# Patient Record
Sex: Female | Born: 1970 | Hispanic: Yes | Smoking: Never smoker
Health system: Southern US, Community
[De-identification: ages and names within clinical notes are randomized; demographics above are authoritative.]

## PROBLEM LIST (undated history)

## (undated) DIAGNOSIS — K589 Irritable bowel syndrome without diarrhea: Secondary | ICD-10-CM

## (undated) DIAGNOSIS — M797 Fibromyalgia: Secondary | ICD-10-CM

## (undated) DIAGNOSIS — IMO0002 Reserved for concepts with insufficient information to code with codable children: Secondary | ICD-10-CM

## (undated) DIAGNOSIS — T7840XA Allergy, unspecified, initial encounter: Secondary | ICD-10-CM

## (undated) DIAGNOSIS — M3500 Sicca syndrome, unspecified: Secondary | ICD-10-CM

## (undated) DIAGNOSIS — F419 Anxiety disorder, unspecified: Secondary | ICD-10-CM

## (undated) DIAGNOSIS — F329 Major depressive disorder, single episode, unspecified: Secondary | ICD-10-CM

## (undated) DIAGNOSIS — M329 Systemic lupus erythematosus, unspecified: Secondary | ICD-10-CM

## (undated) DIAGNOSIS — R109 Unspecified abdominal pain: Secondary | ICD-10-CM

## (undated) DIAGNOSIS — C539 Malignant neoplasm of cervix uteri, unspecified: Secondary | ICD-10-CM

## (undated) DIAGNOSIS — E039 Hypothyroidism, unspecified: Secondary | ICD-10-CM

## (undated) DIAGNOSIS — J189 Pneumonia, unspecified organism: Secondary | ICD-10-CM

## (undated) DIAGNOSIS — N809 Endometriosis, unspecified: Secondary | ICD-10-CM

## (undated) DIAGNOSIS — C73 Malignant neoplasm of thyroid gland: Secondary | ICD-10-CM

## (undated) DIAGNOSIS — D649 Anemia, unspecified: Secondary | ICD-10-CM

## (undated) DIAGNOSIS — F32A Depression, unspecified: Secondary | ICD-10-CM

## (undated) DIAGNOSIS — J45909 Unspecified asthma, uncomplicated: Secondary | ICD-10-CM

## (undated) DIAGNOSIS — E063 Autoimmune thyroiditis: Secondary | ICD-10-CM

## (undated) DIAGNOSIS — M1990 Unspecified osteoarthritis, unspecified site: Secondary | ICD-10-CM

## (undated) HISTORY — PX: ABLATION: SHX5711

## (undated) HISTORY — DX: Unspecified asthma, uncomplicated: J45.909

## (undated) HISTORY — PX: THROAT SURGERY: SHX803

## (undated) HISTORY — DX: Allergy, unspecified, initial encounter: T78.40XA

## (undated) HISTORY — PX: OTHER SURGICAL HISTORY: SHX169

## (undated) HISTORY — DX: Endometriosis, unspecified: N80.9

## (undated) HISTORY — DX: Hypothyroidism, unspecified: E03.9

## (undated) HISTORY — DX: Irritable bowel syndrome, unspecified: K58.9

## (undated) HISTORY — DX: Major depressive disorder, single episode, unspecified: F32.9

## (undated) HISTORY — PX: THYROIDECTOMY: SHX17

## (undated) HISTORY — PX: ABDOMINAL HYSTERECTOMY: SHX81

## (undated) HISTORY — DX: Malignant neoplasm of thyroid gland: C73

## (undated) HISTORY — DX: Unspecified osteoarthritis, unspecified site: M19.90

## (undated) HISTORY — DX: Anemia, unspecified: D64.9

## (undated) HISTORY — PX: OVARIAN CYST SURGERY: SHX726

## (undated) HISTORY — DX: Fibromyalgia: M79.7

## (undated) HISTORY — DX: Autoimmune thyroiditis: E06.3

## (undated) HISTORY — DX: Anxiety disorder, unspecified: F41.9

## (undated) HISTORY — DX: Sjogren syndrome, unspecified: M35.00

## (undated) HISTORY — DX: Unspecified abdominal pain: R10.9

## (undated) HISTORY — DX: Reserved for concepts with insufficient information to code with codable children: IMO0002

## (undated) HISTORY — DX: Systemic lupus erythematosus, unspecified: M32.9

## (undated) HISTORY — DX: Pneumonia, unspecified organism: J18.9

## (undated) HISTORY — DX: Depression, unspecified: F32.A

## (undated) HISTORY — DX: Malignant neoplasm of cervix uteri, unspecified: C53.9

---

## 1997-12-26 ENCOUNTER — Ambulatory Visit (HOSPITAL_COMMUNITY): Admission: RE | Admit: 1997-12-26 | Discharge: 1997-12-26 | Payer: Self-pay | Admitting: Oral Surgery

## 1997-12-26 ENCOUNTER — Encounter: Payer: Self-pay | Admitting: Oral Surgery

## 2001-03-03 ENCOUNTER — Emergency Department (HOSPITAL_COMMUNITY): Admission: EM | Admit: 2001-03-03 | Discharge: 2001-03-03 | Payer: Self-pay | Admitting: Internal Medicine

## 2001-05-18 ENCOUNTER — Inpatient Hospital Stay (HOSPITAL_COMMUNITY): Admission: RE | Admit: 2001-05-18 | Discharge: 2001-05-24 | Payer: Self-pay | Admitting: Psychiatry

## 2001-05-25 ENCOUNTER — Other Ambulatory Visit (HOSPITAL_COMMUNITY): Admission: RE | Admit: 2001-05-25 | Discharge: 2001-06-05 | Payer: Self-pay | Admitting: Psychiatry

## 2004-07-08 ENCOUNTER — Ambulatory Visit: Payer: Self-pay | Admitting: Family Medicine

## 2004-07-12 ENCOUNTER — Ambulatory Visit (HOSPITAL_COMMUNITY): Admission: RE | Admit: 2004-07-12 | Discharge: 2004-07-12 | Payer: Self-pay | Admitting: Family Medicine

## 2004-10-08 ENCOUNTER — Ambulatory Visit: Payer: Self-pay | Admitting: Family Medicine

## 2005-01-24 ENCOUNTER — Ambulatory Visit: Payer: Self-pay | Admitting: Family Medicine

## 2005-02-03 ENCOUNTER — Ambulatory Visit: Payer: Self-pay | Admitting: Family Medicine

## 2005-02-03 ENCOUNTER — Ambulatory Visit (HOSPITAL_COMMUNITY): Admission: RE | Admit: 2005-02-03 | Discharge: 2005-02-03 | Payer: Self-pay | Admitting: Family Medicine

## 2005-02-23 ENCOUNTER — Ambulatory Visit (HOSPITAL_COMMUNITY): Admission: RE | Admit: 2005-02-23 | Discharge: 2005-02-23 | Payer: Self-pay | Admitting: Family Medicine

## 2005-04-22 ENCOUNTER — Ambulatory Visit (HOSPITAL_COMMUNITY): Admission: RE | Admit: 2005-04-22 | Discharge: 2005-04-22 | Payer: Self-pay | Admitting: Otolaryngology

## 2005-04-29 ENCOUNTER — Encounter (INDEPENDENT_AMBULATORY_CARE_PROVIDER_SITE_OTHER): Payer: Self-pay | Admitting: *Deleted

## 2005-04-29 ENCOUNTER — Ambulatory Visit (HOSPITAL_COMMUNITY): Admission: RE | Admit: 2005-04-29 | Discharge: 2005-04-29 | Payer: Self-pay | Admitting: Otolaryngology

## 2005-05-03 ENCOUNTER — Ambulatory Visit: Payer: Self-pay | Admitting: Family Medicine

## 2005-06-03 ENCOUNTER — Ambulatory Visit: Payer: Self-pay | Admitting: Family Medicine

## 2005-06-08 ENCOUNTER — Ambulatory Visit (HOSPITAL_COMMUNITY): Admission: RE | Admit: 2005-06-08 | Discharge: 2005-06-09 | Payer: Self-pay | Admitting: Otolaryngology

## 2005-06-08 ENCOUNTER — Encounter (INDEPENDENT_AMBULATORY_CARE_PROVIDER_SITE_OTHER): Payer: Self-pay | Admitting: Specialist

## 2005-08-04 ENCOUNTER — Encounter: Payer: Self-pay | Admitting: Family Medicine

## 2005-08-04 ENCOUNTER — Ambulatory Visit: Payer: Self-pay | Admitting: Family Medicine

## 2005-08-04 ENCOUNTER — Other Ambulatory Visit: Admission: RE | Admit: 2005-08-04 | Discharge: 2005-08-04 | Payer: Self-pay | Admitting: Family Medicine

## 2006-02-21 ENCOUNTER — Ambulatory Visit: Payer: Self-pay | Admitting: Family Medicine

## 2006-10-27 ENCOUNTER — Ambulatory Visit (HOSPITAL_COMMUNITY): Admission: RE | Admit: 2006-10-27 | Discharge: 2006-10-27 | Payer: Self-pay | Admitting: Family Medicine

## 2007-02-19 ENCOUNTER — Ambulatory Visit (HOSPITAL_COMMUNITY): Admission: RE | Admit: 2007-02-19 | Discharge: 2007-02-19 | Payer: Self-pay | Admitting: Internal Medicine

## 2007-02-20 ENCOUNTER — Ambulatory Visit (HOSPITAL_COMMUNITY): Admission: RE | Admit: 2007-02-20 | Discharge: 2007-02-20 | Payer: Self-pay | Admitting: Internal Medicine

## 2007-03-22 ENCOUNTER — Encounter: Payer: Self-pay | Admitting: Family Medicine

## 2007-06-19 ENCOUNTER — Encounter: Admission: RE | Admit: 2007-06-19 | Discharge: 2007-06-19 | Payer: Self-pay | Admitting: Otolaryngology

## 2008-02-22 ENCOUNTER — Ambulatory Visit (HOSPITAL_COMMUNITY): Admission: RE | Admit: 2008-02-22 | Discharge: 2008-02-22 | Payer: Self-pay | Admitting: Obstetrics and Gynecology

## 2008-02-22 ENCOUNTER — Encounter (INDEPENDENT_AMBULATORY_CARE_PROVIDER_SITE_OTHER): Payer: Self-pay | Admitting: Obstetrics and Gynecology

## 2009-01-09 ENCOUNTER — Other Ambulatory Visit: Admission: RE | Admit: 2009-01-09 | Discharge: 2009-01-09 | Payer: Self-pay | Admitting: Obstetrics and Gynecology

## 2009-05-05 ENCOUNTER — Inpatient Hospital Stay (HOSPITAL_COMMUNITY): Admission: RE | Admit: 2009-05-05 | Discharge: 2009-05-07 | Payer: Self-pay | Admitting: Obstetrics and Gynecology

## 2009-05-05 ENCOUNTER — Encounter: Payer: Self-pay | Admitting: Obstetrics and Gynecology

## 2009-05-05 ENCOUNTER — Ambulatory Visit (HOSPITAL_COMMUNITY): Admission: RE | Admit: 2009-05-05 | Discharge: 2009-05-05 | Payer: Self-pay | Admitting: Obstetrics and Gynecology

## 2009-12-07 ENCOUNTER — Ambulatory Visit (HOSPITAL_COMMUNITY): Admission: RE | Admit: 2009-12-07 | Discharge: 2009-12-07 | Payer: Self-pay | Admitting: Family Medicine

## 2010-04-20 NOTE — Letter (Signed)
Summary: RPC chart  RPC chart   Imported By: Curtis Sites 10/08/2009 10:33:15  _____________________________________________________________________  External Attachment:    Type:   Image     Comment:   External Document

## 2010-06-10 LAB — CBC
HCT: 33.3 % — ABNORMAL LOW (ref 36.0–46.0)
HCT: 42.2 % (ref 36.0–46.0)
Hemoglobin: 12.3 g/dL (ref 12.0–15.0)
MCHC: 34.3 g/dL (ref 30.0–36.0)
MCHC: 34.9 g/dL (ref 30.0–36.0)
MCV: 87.2 fL (ref 78.0–100.0)
MCV: 88.3 fL (ref 78.0–100.0)
Platelets: 202 10*3/uL (ref 150–400)
Platelets: 211 10*3/uL (ref 150–400)
Platelets: 266 10*3/uL (ref 150–400)
RBC: 4.06 MIL/uL (ref 3.87–5.11)
RBC: 4.84 MIL/uL (ref 3.87–5.11)
RDW: 12.5 % (ref 11.5–15.5)
WBC: 6.4 10*3/uL (ref 4.0–10.5)
WBC: 6.7 10*3/uL (ref 4.0–10.5)

## 2010-06-10 LAB — DIFFERENTIAL
Basophils Absolute: 0 10*3/uL (ref 0.0–0.1)
Eosinophils Absolute: 0.1 10*3/uL (ref 0.0–0.7)
Eosinophils Relative: 2 % (ref 0–5)
Lymphocytes Relative: 32 % (ref 12–46)
Lymphs Abs: 2.5 10*3/uL (ref 0.7–4.0)
Monocytes Relative: 6 % (ref 3–12)
Neutro Abs: 4.7 10*3/uL (ref 1.7–7.7)
Neutrophils Relative %: 61 % (ref 43–77)

## 2010-06-10 LAB — BASIC METABOLIC PANEL
BUN: 8 mg/dL (ref 6–23)
GFR calc Af Amer: >60 mL/min (ref >60)
GFR calc non Af Amer: >60 mL/min (ref >60)
Glucose, Bld: 97 mg/dL (ref 70–99)

## 2010-06-10 LAB — TYPE AND SCREEN: Antibody Screen: NEGATIVE

## 2010-08-03 NOTE — Op Note (Signed)
NAMEAMERIE, Chelsey [name]              ACCOUNT NO.:  [id number]   MEDICAL RECORD NO.:  [id number]          PATIENT TYPE:  AMB   LOCATION:  SDC                           FACILITY:  [facility]   PHYSICIAN:  Osborn Coho, M.D.   DATE OF BIRTH:  08/28/1970   DATE OF PROCEDURE:  02/22/2008  DATE OF DISCHARGE:                               OPERATIVE REPORT   PREOPERATIVE DIAGNOSES:  1. Menorrhagia.  2. Fibroids.   POSTOPERATIVE DIAGNOSIS:  1. Menorrhagia.  2. Fibroids.   PROCEDURE:  1. Hysteroscopy.  2. Dilation and curettage.  3. Endometrial ablation via hydrothermal ablation.   ATTENDING:  Osborn Coho, MD   ANESTHESIA:  General via LMA.   SPECIMENS TO PATHOLOGY:  Endometrial curettings.   FLUIDS:  1500 mL   URINE OUTPUT:  Quantity sufficient, via straight cath prior to  procedure.   ESTIMATED BLOOD LOSS:  Minimal.   COMPLICATIONS:  None.   PROCEDURE IN DETAIL:  The patient was taken to the operating room after  the risks, benefits, and alternatives were discussed with the patient.  The patient verbalized understanding and consent signed and witnessed.  The patient was placed under general per anesthesia and prepped and  draped in the normal sterile fashion in the dorsal lithotomy position.  A bivalve speculum was placed in the patient's vagina and the anterior  lip of the cervix was grasped with single-tooth tenaculum.  The cervix  was dilated for passage of the hysteroscope and the uterus sounded to  9.5 cm.  Hysteroscope was introduced after dilating the cervix and  ablation performed without difficulty.  There  was no loss of fluid, and endometrial ablation results at the end looked  good.  Curettage was performed.  All instruments were removed.  There  was good hemostasis at the tenaculum site.  Count was correct.  The  patient tolerated the procedure well and is currently awaiting transfer  to the recovery room in good condition.      Osborn Coho, M.D.  Electronically Signed     AR/MEDQ  D:  02/22/2008  T:  02/23/2008  Job:  [id number]

## 2010-08-06 NOTE — Discharge Summary (Signed)
[facility]  Patient:    Chelsey [name], Chelsey [name] Visit Number: [id number] MRN: [id number]          Service Type: PSY Location: [facility] Attending Physician:  Benjaman Pott Dictated by:   Jeanice Lim, M.D. Admit Date:  05/25/2001 Discharge Date: 06/05/2001                             Discharge Summary  IDENTIFYING DATA:  This is a 40 year old married Hispanic female voluntarily admitted for suicidal and homicidal ideations with a history of depression and anxiety for four months.  ADMISSION MEDICATIONS: 1. Paxil. 2. BuSpar. 3. Klonopin.  ALLERGIES:   CELEBREX.  PHYSICAL EXAMINATION:  GENERAL:  Essentially within normal limits.  NEUROLOGIC:  Nonfocal.  ROUTINE ADMISSION LABORATORY DATA:  Urine drug screen: Negative.  Alcohol: Less than 5.  CMET and CBC: Within normal limits.  Urinalysis: Negative.  MENTAL STATUS EXAMINATION:  The patient was an alert, young middle-aged Hispanic, cooperative with good eye contact.  Speech: Within normal limits. Mood: Depressed, guilty.  Affect: Sad.  Thought process: Goal directed; no psychotic symptoms or dangerous ideation.  Cognitive: Intact.  Judgment and insight: Fair with a history of poor impulse control.  ADMITTING DIAGNOSES: Axis I:    Major depression, recurrent, severe. Axis II:   None. Axis III:  None. Axis IV:   Moderate problems with primary support group. Axis V:    30/60  HOSPITAL COURSE:  The patient was admitted and ordered routine p.r.n. medications and resumed on Paxil, BuSpar, and Klonopin.  Paxil was tapered and Zyprexa started as well as Wellbutrin, targeting depressive symptoms and mood lability and agitation.  Zyprexa and Wellbutrin were titrated as tolerated. The patient reported a positive response to medication changes without side effects.  CONDITION AT DISCHARGE:  Markedly improved.  Mood: More euthymic and stable. Affect: Brighter.  Thought process: Goal directed.   Thought content: Negative for dangerous ideation or psychotic symptoms.  The patient reported motivation to be compliant with the followup plan.  DISCHARGE MEDICATIONS: 1. Zyprexa 20 mg q.h.s. 2. BuSpar 15 mg b.i.d. 3. Trazodone 100 mg q.h.s. 4. Wellbutrin SR 150 mg q.a.m. and 3 p.m. 5. Depakote 250 mg q.a.m., 3 p.m. and q.h.s. 6. Zyrtec 10 mg q.h.s.  DISCHARGE INSTRUCTIONS:  The patient was to have a Depakote level checked on March 11 along with liver function tests.  FOLLOWUP:  Mental health Intensive Outpatient Program on March 7 at 9 a.m.  DISCHARGE DIAGNOSES: Axis I:    Major depression, recurrent, severe. Axis II:   None. Axis III:  None. Axis IV:   Moderate problems with primary support group. Axis V:    Global assessment of functioning on discharge was 55. Dictated by:   Jeanice Lim, M.D. Attending Physician:  Carolanne Grumbling D DD:  07/04/01 TD:  07/04/01 Job: [id number]

## 2010-08-06 NOTE — Op Note (Signed)
Chelsey [name], [name]              ACCOUNT NO.:  [id number]   MEDICAL RECORD NO.:  [id number]          PATIENT TYPE:  OIB   LOCATION:  5729                         FACILITY:  [facility]   PHYSICIAN:  [name], M.D. DATE OF BIRTH:  July 11, 1970   DATE OF PROCEDURE:  06/08/2005  DATE OF DISCHARGE:                                 OPERATIVE REPORT   PREOPERATIVE DIAGNOSES:  1.  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  2.  Chronic tonsillitis.   POSTOPERATIVE DIAGNOSES:  1.  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  2.  Chronic tonsillitis.   PROCEDURE PERFORMED:  1.  Left neck branchial cleft cyst excision.  2.  Tonsillectomy.  3.  Adenoid ablation.   SURGEON:  Gloris Manchester. Lazarus Salines, M.D.   ASSISTANT:  Kinnie Scales. Annalee Genta, M.D.   ANESTHESIA:  General orotracheal.   BLOOD LOSS:  Minimal.   COMPLICATIONS:  None.   FINDINGS:  A thin-walled dark, roughly 3 x 2 x 2-cm soft cyst in the left  lower neck in between the carotid artery and the jugular vein with no  evident tract or extension.  No palpable abnormality of the thyroid gland  nor additional nodes.  Frozen section revealed benign cyst.  Chronic  fibrotic tonsils imbedded with cryptic debris.  Moderate residual midline  adenoid bud.   PROCEDURE:  With the patient in a comfortable supine position, general  orotracheal anesthesia was induced without difficulty.  At an appropriate  level, the patient was placed in a slight sitting position.  A shoulder roll  was placed and the neck was extended.  The proposed surgical field was  infiltrated with 1% Xylocaine with 1:100,000 epinephrine, 6 mL total.  A  sterile preparation and draping of the entire left neck, upper chest and  lower face was accomplished in the standard fashion.   A 6-cm incision was planned along a relaxed skin tension line in the low  left neck.  At this point, the head was  rotated toward the right for better  access to the left neck.  The incision was sharply executed and carried down  through skin, subcutaneous fat, and the platysma muscle.  The subplatysmal  planes were elevated superiorly, anteriorly and inferiorly and retained with  self-retaining retractors.  The fascia of the sternocleidomastoid muscle was  incised at its anterior edge and dissection was carried along the medial  surface of the muscle down towards the great vessels.  Palpation revealed a  soft cystic mass in the lower jugular chain.  Review of the CT scans  revealed this to be the appropriate location.  The soft tissues were  dissected away from the omohyoid muscle, which was elevated upward.  Middle  thyroid vein was identified and controlled with silk ligatures and divided.  Working along the jugular vein, the fascia was gently elevated bluntly and  sharply and carried around the anterior surface and also around the  posterior surface.  The cyst wound up being a little easier  to access from  posteriorly; therefore, the jugular vein was rolled forward and using sharp  knife dissection, the cyst was dissected free of the jugular vein.  Upon  freeing the vein, which was then retracted, the cyst was further dissected  bluntly and sharply around its anterior, medial, inferior, posterior and  deep poles.  The dissection was carried up to the superior pole under direct  vision.  There was no identified tract and the final bit of soft tissue was  divided with the cautery and the specimen sent for frozen section  interpretation.  Hemostasis was observed.  The wound was irrigated.   After receiving the frozen section report of benign cyst, no further therapy  was felt indicated.  A one-quarter-inch Penrose drain was placed in the  depths of the wound and brought out the anterior skin incision.  The  sternocleidomastoid muscle was tacked back to the strap muscles with  interrupted 4-0 chromic  sutures.  The platysma muscle layer was closed with  the same suture.  Finally, the skin was closed in a cosmetic fashion with a  running subcuticular 5-0 Ethilon stitch.  Hemostasis was again observed.  Finally, the skin was painted with Benzoin and 1/2-inch Steri-Strips were  used for final cosmetic approximation.  A fluff-and-Ace-wrap dressing was  applied in the standard fashion.  This completed the neck surgery.   At this point, the table was flattened, turned 90 degrees, and the patient  was placed in Trendelenburg.  Taking care to protect lips, teeth, and  endotracheal tube, the Crowe-Davis mouth gag was introduced, expanded for  visualization, and suspended from the Mayo stand in the standard fashion.  The findings were as described above.  A palate retractor and mirror were  used to visualize the nasopharynx with a moderate adenoid bud identified.  0.5% Xylocaine with 1:200,000 epinephrine, 7 mL total, was infiltrated into  the peritonsillar planes on both sides for intraoperative hemostasis.   A red rubber catheter was passed through the nose and out the mouth to serve  as a Producer, television/film/video.  Using indirect visualization and suction cautery,  the adenoid bud was ablated with cautery.  There was no significant bleeding  and this completed the adenoid ablation.   Beginning on the left side, the tonsil was grasped and retracted medially.  It was fibrotic, imbedded, and with cryptic debris.  The mucosa overlying  the anterior and superior poles was coagulated and cut down to the capsule  of the tonsil.  Using the cautery tip as a blunt dissector, lysing fibrous  bands, and coagulating crossing vessels as identified, the tonsil was  dissected from its muscular pocket from superiorly downward.  The tonsil was  removed in its entirety as determined by examination of both tonsil and fossa.  A small additional quantity of cautery rendered the fossa  hemostatic.  After completing left  tonsillectomy, the right side was done in  identical fashion.   After completing both tonsillectomies and rendering the oropharynx  hemostatic, the palate retractor and mouth gag were relaxed for several  minutes.  Upon re-expansion, there was slight oozing from the superior pole  of the left tonsil, which was further coagulated.  The palate retractor and  mouth gag were relaxed for several additional minutes and at this point,  hemostasis was observed.  The palate retractor and mouth gag were relaxed  and removed.  The dental status was intact.  The patient was returned to  Anesthesia, awakened, extubated, and  transferred to Recovery in stable  condition.   COMMENT:  [age]-year-old Hispanic female with a recent history of a  mass developing in the left lower neck with a differential diagnosis  including branchial cleft cyst versus cystic thyroid metastasis was  indication for that portion of today's procedure.  The patient has had  chronic recurrent tonsillitis which was indication that portion of  the procedure.  Will anticipate routine postoperative recovery including  attention to analgesia, antibiosis, hydration, and observation for bleeding,  emesis, or airway compromise.  Will remove the drain in 24 hours and be  prepared to discharge her to her home the care of her family at that time.      Gloris Manchester. Lazarus Salines, M.D.  Electronically Signed     KTW/MEDQ  D:  06/08/2005  T:  06/09/2005  Job:  [id number]   cc:   Milus Mallick. Lodema Hong, M.D.  Fax: [phone]

## 2010-12-24 LAB — CBC
Platelets: 270 10*3/uL (ref 150–400)
RDW: 13.2 % (ref 11.5–15.5)

## 2010-12-24 LAB — HCG, SERUM, QUALITATIVE: Preg, Serum: NEGATIVE

## 2011-05-03 ENCOUNTER — Other Ambulatory Visit (HOSPITAL_COMMUNITY): Payer: Self-pay | Admitting: Family Medicine

## 2011-05-03 DIAGNOSIS — Z139 Encounter for screening, unspecified: Secondary | ICD-10-CM

## 2011-05-09 ENCOUNTER — Ambulatory Visit (HOSPITAL_COMMUNITY)
Admission: RE | Admit: 2011-05-09 | Discharge: 2011-05-09 | Disposition: A | Discharge status: Home / Self Care | Payer: Medicaid Other | Source: Ambulatory Visit | Attending: Family Medicine | Admitting: Family Medicine

## 2011-05-09 DIAGNOSIS — Z139 Encounter for screening, unspecified: Secondary | ICD-10-CM

## 2011-05-09 DIAGNOSIS — Z1231 Encounter for screening mammogram for malignant neoplasm of breast: Secondary | ICD-10-CM | POA: Insufficient documentation

## 2012-06-18 ENCOUNTER — Ambulatory Visit (INDEPENDENT_AMBULATORY_CARE_PROVIDER_SITE_OTHER): Payer: Medicaid Other | Admitting: General Practice

## 2012-06-18 ENCOUNTER — Encounter: Payer: Self-pay | Admitting: General Practice

## 2012-06-18 VITALS — BP 117/76 | HR 78 | Temp 98.0°F | Ht 64.5 in | Wt 168.0 lb

## 2012-06-18 DIAGNOSIS — G8929 Other chronic pain: Secondary | ICD-10-CM

## 2012-06-18 DIAGNOSIS — F411 Generalized anxiety disorder: Secondary | ICD-10-CM

## 2012-06-18 DIAGNOSIS — E039 Hypothyroidism, unspecified: Secondary | ICD-10-CM

## 2012-06-18 DIAGNOSIS — Z0000 Encounter for general adult medical examination without abnormal findings: Secondary | ICD-10-CM

## 2012-06-18 DIAGNOSIS — M549 Dorsalgia, unspecified: Secondary | ICD-10-CM

## 2012-06-18 LAB — POCT CBC
Granulocyte percent: 68.2 %G (ref 37–80)
HCT, POC: 42.1 % (ref 37.7–47.9)
MCH, POC: 29.3 pg (ref 27–31.2)
MCV: 84.6 fL (ref 80–97)
RBC: 5 M/uL (ref 4.04–5.48)
RDW, POC: 12.1 %
WBC: 7.2 10*3/uL (ref 4.6–10.2)

## 2012-06-18 LAB — COMPLETE METABOLIC PANEL WITHOUT GFR
ALT: 16 U/L (ref 0–35)
AST: 14 U/L (ref 0–37)
Albumin: 3.8 g/dL (ref 3.5–5.2)
Alkaline Phosphatase: 56 U/L (ref 39–117)
BUN: 20 mg/dL (ref 6–23)
CO2: 22 meq/L (ref 19–32)
Calcium: 8.5 mg/dL (ref 8.4–10.5)
Chloride: 106 meq/L (ref 96–112)
Creat: 0.48 mg/dL — ABNORMAL LOW (ref 0.50–1.10)
GFR, Est African American: >89 mL/min
GFR, Est Non African American: >89 mL/min
Glucose, Bld: 88 mg/dL (ref 70–99)
Potassium: 4.5 meq/L (ref 3.5–5.3)
Sodium: 139 meq/L (ref 135–145)
Total Bilirubin: 0.6 mg/dL (ref 0.3–1.2)
Total Protein: 6.1 g/dL (ref 6.0–8.3)

## 2012-06-18 LAB — VITAMIN B12: Vitamin B-12: 359 pg/mL (ref 211–911)

## 2012-06-18 LAB — LIPID PANEL
Cholesterol: 123 mg/dL (ref 0–200)
HDL: 41 mg/dL
LDL Cholesterol: 65 mg/dL (ref 0–99)
Total CHOL/HDL Ratio: 3 ratio
Triglycerides: 85 mg/dL
VLDL: 17 mg/dL (ref 0–40)

## 2012-06-18 NOTE — Patient Instructions (Addendum)
Anxiety and Panic Attacks Your caregiver has informed you that you are having an anxiety or panic attack. There may be many forms of this. Most of the time these attacks come suddenly and without warning. They come at any time of day, including periods of sleep, and at any time of life. They may be strong and unexplained. Although panic attacks are very scary, they are physically harmless. Sometimes the cause of your anxiety is not known. Anxiety is a protective mechanism of the body in its fight or flight mechanism. Most of these perceived danger situations are actually nonphysical situations (such as anxiety over losing a job). CAUSES  The causes of an anxiety or panic attack are many. Panic attacks may occur in otherwise healthy people given a certain set of circumstances. There may be a genetic cause for panic attacks. Some medications may also have anxiety as a side effect. SYMPTOMS  Some of the most common feelings are:  Intense terror.  Dizziness, feeling faint.  Hot and cold flashes.  Fear of going crazy.  Feelings that nothing is real.  Sweating.  Shaking.  Chest pain or a fast heartbeat (palpitations).  Smothering, choking sensations.  Feelings of impending doom and that death is near.  Tingling of extremities, this may be from over-breathing.  Altered reality (derealization).  Being detached from yourself (depersonalization). Several symptoms can be present to make up anxiety or panic attacks. DIAGNOSIS  The evaluation by your caregiver will depend on the type of symptoms you are experiencing. The diagnosis of anxiety or panic attack is made when no physical illness can be determined to be a cause of the symptoms. TREATMENT  Treatment to prevent anxiety and panic attacks may include:  Avoidance of circumstances that cause anxiety.  Reassurance and relaxation.  Regular exercise.  Relaxation therapies, such as yoga.  Psychotherapy with a psychiatrist or  therapist.  Avoidance of caffeine, alcohol and illegal drugs.  Prescribed medication. SEEK IMMEDIATE MEDICAL CARE IF:   You experience panic attack symptoms that are different than your usual symptoms.  You have any worsening or concerning symptoms. Document Released: 03/07/2005 Document Revised: 05/30/2011 Document Reviewed: 07/09/2009 [facility] Patient Information 2013 [place], [place]. Hypothyroidism The thyroid is a large gland located in the lower front of your neck. The thyroid gland helps control metabolism. Metabolism is how your body handles food. It controls metabolism with the hormone thyroxine. When this gland is underactive (hypothyroid), it produces too little hormone.  CAUSES These include:   Absence or destruction of thyroid tissue.  Goiter due to iodine deficiency.  Goiter due to medications.  Congenital defects (since birth).  Problems with the pituitary. This causes a lack of TSH (thyroid stimulating hormone). This hormone tells the thyroid to turn out more hormone. SYMPTOMS  Lethargy (feeling as though you have no energy)  Cold intolerance  Weight gain (in spite of normal food intake)  Dry skin  Coarse hair  Menstrual irregularity (if severe, may lead to infertility)  Slowing of thought processes Cardiac problems are also caused by insufficient amounts of thyroid hormone. Hypothyroidism in the newborn is cretinism, and is an extreme form. It is important that this form be treated adequately and immediately or it will lead rapidly to retarded physical and mental development. DIAGNOSIS  To prove hypothyroidism, your caregiver may do blood tests and ultrasound tests. Sometimes the signs are hidden. It may be necessary for your caregiver to watch this illness with blood tests either before or after diagnosis and treatment. TREATMENT  Low levels of thyroid hormone are increased by using synthetic thyroid hormone. This is a safe, effective treatment. It  usually takes about four weeks to gain the full effects of the medication. After you have the full effect of the medication, it will generally take another four weeks for problems to leave. Your caregiver may start you on low doses. If you have had heart problems the dose may be gradually increased. It is generally not an emergency to get rapidly to normal. HOME CARE INSTRUCTIONS   Take your medications as your caregiver suggests. Let your caregiver know of any medications you are taking or start taking. Your caregiver will help you with dosage schedules.  As your condition improves, your dosage needs may increase. It will be necessary to have continuing blood tests as suggested by your caregiver.  Report all suspected medication side effects to your caregiver. SEEK MEDICAL CARE IF: Seek medical care if you develop:  Sweating.  Tremulousness (tremors).  Anxiety.  Rapid weight loss.  Heat intolerance.  Emotional swings.  Diarrhea.  Weakness. SEEK IMMEDIATE MEDICAL CARE IF:  You develop chest pain, an irregular heart beat (palpitations), or a rapid heart beat. MAKE SURE YOU:   Understand these instructions.  Will watch your condition.  Will get help right away if you are not doing well or get worse. Document Released: 03/07/2005 Document Revised: 05/30/2011 Document Reviewed: 10/26/2007 [facility] Patient Information 2013 [place], [place]. Chronic Back Pain  When back pain lasts longer than 3 months, it is called chronic back pain.People with chronic back pain often go through certain periods that are more intense (flare-ups).  CAUSES Chronic back pain can be caused by wear and tear (degeneration) on different structures in your back. These structures include:  The bones of your spine (vertebrae) and the joints surrounding your spinal cord and nerve roots (facets).  The strong, fibrous tissues that connect your vertebrae (ligaments). Degeneration of these structures may  result in pressure on your nerves. This can lead to constant pain. HOME CARE INSTRUCTIONS  Avoid bending, heavy lifting, prolonged sitting, and activities which make the problem worse.  Take brief periods of rest throughout the day to reduce your pain. Lying down or standing usually is better than sitting while you are resting.  Take over-the-counter or prescription medicines only as directed by your caregiver. SEEK IMMEDIATE MEDICAL CARE IF:   You have weakness or numbness in one of your legs or feet.  You have trouble controlling your bladder or bowels.  You have nausea, vomiting, abdominal pain, shortness of breath, or fainting. Document Released: 04/14/2004 Document Revised: 05/30/2011 Document Reviewed: 02/19/2011 [facility] Patient Information 2013 [place], [place].

## 2012-06-18 NOTE — Progress Notes (Signed)
  Subjective:    Patient ID: Chelsey [name], female    DOB: 03/27/1970, 42 y.o.   MRN: [id number]  HPI A 42 y.o. Female patient presents today for physical exam. Patient reports taking multiple medications for chronic health problems. Reports having hypothyroidism, taking armour. GYN is managing her hypothyroidism, paps, and mammograms. Chronic back pain taking hydrocodone, mobic, valium, and ultram. Reports an internal med physician was managing her other conditions. Reports that she wanted to change physicians, for different management. Reports having injections in her back (epidural and steriod). 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 Klonopin is taken for sleep. Reports having irritable bowel symptoms, but wants to be sent to GI for official diagnosis. Reports loosing weight due to diarrhea and indigestion. Reports eating healthy diet and denies exercise. Reports decreasing down two clothes sizes in past two months. Reports having a stressful situation (patient and 2 sons) and seeking counseling. Denies thoughts harming self or suicidal ideations. Reports having refills on ultram and mobic which seem to be helping to reduce the pain.     Review of Systems  Constitutional: Negative for fever, chills, activity change and appetite change.  HENT: Positive for neck pain. Negative for ear discharge.        Neck pain due to back and TMJ   Cardiovascular: Negative for chest pain and palpitations.  Gastrointestinal: Positive for vomiting, abdominal pain and diarrhea. Negative for blood in stool.       Mild pain intermittently after eating TMJ pain causes nausea at times   Genitourinary: Positive for pelvic pain. Negative for dysuria, vaginal discharge and difficulty urinating.       Endometriosis  Musculoskeletal: Positive for myalgias and back pain.       Chronic   Skin: Negative.  Negative for rash.  Neurological: Positive for dizziness and headaches.       Vertigo  Psychiatric/Behavioral:  Positive for agitation. The patient is nervous/anxious.        Due to chronic illnesses        Objective:   Physical Exam  Constitutional: She is oriented to person, place, and time. She appears well-developed and well-nourished.  HENT:  Head: Normocephalic and atraumatic.  Eyes: Conjunctivae and EOM are normal. Pupils are equal, round, and reactive to light.  Neck:  Reports pain with range of motion  Cardiovascular: Normal rate, regular rhythm and normal heart sounds.   No murmur heard. Pulmonary/Chest: Effort normal and breath sounds normal.  Abdominal: She exhibits no distension. There is no tenderness. There is no rebound.  Musculoskeletal: She exhibits no tenderness.  Reports pain with range of motion of hands, neck, and back  Neurological: She is alert and oriented to person, place, and time.  Skin: Skin is warm and dry. No rash noted.  Psychiatric:  Patient seems a mildly anxious while talking about health problems          Assessment & Plan:  Labs pending Consultation with clinical pharmacist (appointment to be scheduled by patient) to discuss current medications F/u after pharmacist consultation Discussed exercise and diet  Patient verbalized agreement and understanding of all discussed. Denies having questions   Raymon Mutton, FNP-C

## 2012-06-19 ENCOUNTER — Telehealth: Payer: Self-pay | Admitting: *Deleted

## 2012-06-19 ENCOUNTER — Other Ambulatory Visit: Payer: Self-pay | Admitting: General Practice

## 2012-06-19 DIAGNOSIS — E559 Vitamin D deficiency, unspecified: Secondary | ICD-10-CM

## 2012-06-19 LAB — VITAMIN D 25 HYDROXY (VIT D DEFICIENCY, FRACTURES): Vit D, 25-Hydroxy: 25 ng/mL — ABNORMAL LOW (ref 30–89)

## 2012-06-19 MED ORDER — VITAMIN D3 1.25 MG (50000 UT) PO CAPS: 1.0000 | ORAL_CAPSULE | ORAL | Status: DC

## 2012-06-19 NOTE — Telephone Encounter (Signed)
Patient notified of labwork. Will call back closer to 8 weeks to make appt for return labs

## 2012-06-21 ENCOUNTER — Telehealth: Payer: Self-pay | Admitting: General Practice

## 2012-06-21 NOTE — Telephone Encounter (Signed)
Please advise 

## 2012-06-21 NOTE — Telephone Encounter (Signed)
All other allergy meds are OTC.

## 2012-06-22 ENCOUNTER — Other Ambulatory Visit: Payer: Self-pay | Admitting: General Practice

## 2012-06-22 DIAGNOSIS — J309 Allergic rhinitis, unspecified: Secondary | ICD-10-CM

## 2012-06-22 MED ORDER — CETIRIZINE HCL 10 MG PO TABS: 10.0000 mg | ORAL_TABLET | Freq: Every day | ORAL | Status: DC

## 2012-06-22 NOTE — Telephone Encounter (Signed)
Prescription sent to pharmacy.

## 2012-06-22 NOTE — Telephone Encounter (Signed)
PLEASE ADVISE.

## 2012-07-05 ENCOUNTER — Other Ambulatory Visit: Payer: Self-pay | Admitting: Pharmacist

## 2012-07-05 ENCOUNTER — Ambulatory Visit (INDEPENDENT_AMBULATORY_CARE_PROVIDER_SITE_OTHER): Payer: Medicaid Other | Admitting: Pharmacist

## 2012-07-05 VITALS — BP 124/76 | HR 75 | Ht 64.5 in | Wt 165.0 lb

## 2012-07-05 DIAGNOSIS — F419 Anxiety disorder, unspecified: Secondary | ICD-10-CM | POA: Insufficient documentation

## 2012-07-05 DIAGNOSIS — IMO0001 Reserved for inherently not codable concepts without codable children: Secondary | ICD-10-CM

## 2012-07-05 DIAGNOSIS — G894 Chronic pain syndrome: Secondary | ICD-10-CM

## 2012-07-05 DIAGNOSIS — E039 Hypothyroidism, unspecified: Secondary | ICD-10-CM | POA: Insufficient documentation

## 2012-07-05 DIAGNOSIS — E559 Vitamin D deficiency, unspecified: Secondary | ICD-10-CM | POA: Insufficient documentation

## 2012-07-05 DIAGNOSIS — M797 Fibromyalgia: Secondary | ICD-10-CM | POA: Insufficient documentation

## 2012-07-05 NOTE — Progress Notes (Signed)
Patient ID: Chelsey [name], female   DOB: [birth year], 42 y.o.   MRN: [id number]  Chief Complaint  Patient presents with  . Pain  . Fibromyalgia  . Medication Management    Filed Vitals:   07/05/12 2204  BP: 124/76  Pulse: 75    Filed Weights   07/05/12 2204  Weight: 165 lb (74.844 kg)   Body mass index is 27.9 kg/(m^2).  HPI:  Pt referred by Ruthell Rummage, NP for medication management with specific attention to patient's pain management regimen.   Chelsey [name] previously saw Dr. Regino Schultze at [facility] but per patient was discharged but she is not really clear as to why.  She has been diagnosed with fibromyalgia and chronic pain syndrome and she also mentions a diagnosis of spinal stenosis and pain in abdomen and ribs related to endometriosis.  Pt had a hysterectomy in 2011.   Current medications:  Tramadol 50mg  1t po q6hprn; finacea 15% cream AAA BID; zyrtec 10mg  1t poqd;  viatmin D3 50,000IU 1 cap qw; cyclobenzaprine 10mg  1t po tid prn muscle spasms; restasis 0.05% 1drop bid; diazepam 10mg  1t po 15h as needed for anxiety; armour thyroid 60mg  2.5 tablets by mouth daily; meloxicam 7.5mg  1t po bid; hydrocodone/APAP 5/500mg  1t po 16h as neede for pain  Per patient doesn't like to take medication and she only uses pain medications, muscle relaxer and anxiety medications when needed. Previous medications tried for fibromyalgia / pain include:  savella - caused severe nausea and extreme weight loss cymbalta - caused severe nausea and weight loss, mental status changes Spinal epidural - patient felt this helped in past but missed last appointment but to transportation problems because office is in [place]. Brand Norco - patient states she took this after her hysterectomy in 2011 and that it worked well and did not cause itching and anxiousness which she reports with her current hydrocodone/apap 5/500mg .  Reviewed notes from Dr. Regino Schultze Reviewed [place] Controlled Drug Report for patient  for last 6 months - important to note that Chelsey [name] has not had hydrocodone/APAP 5/500 filled since 11/2011 when she got #90 - per patient she does not take this medication often due to how it makes her feel.  She has had diazepam fill regularly.  Location of Pain: all over but worse in back and legs Describes pain as dull, aching, constant pain Current pain score 4/10 Worse pain score in last 14 days 8/10 Best pain score in last 14 days 4/10  Activity - very little.  Per patient she is unable to do much housework and avoids physical activity.   Assessment: Chronic pain syndrome Fibromyalgia Medication review  Plan: 1. Reviewed our pain contract with patient and answered any questions.  Patient signed contract.  2.  Urine drug screen checked today - pending 3.  Continue current medications until results from drug screen available.  Will then consider alternatives to current therapy if needed. 4.  Encouraged patient to consider referral to Dr. Laurian Brim for spinal epidural as this has helped in past and his office is closer.   5.  Recommended [employer] program for fibromyalgia / water classes.

## 2012-07-05 NOTE — Progress Notes (Deleted)
Patient ID: Chelsey [name], female   DOB: 11/01/1970, 41 y.o.   MRN: [id number]  Chief Complaint  Patient presents with  . Pain  . Fibromyalgia  . Medication Management    Filed Vitals:   07/05/12 2204  BP: 124/76  Pulse: 75    Filed Weights   07/05/12 2204  Weight: 165 lb (74.844 kg)   Body mass index is 27.9 kg/(m^2).  HPI:  Pt referred by Mae Martin, NP for medication management with specific attention to patient's pain management regimen.   Chelsey [name] previously saw Dr. McGough at [facility] but per patient was discharged but she is not really clear as to why.  She has been diagnosed with fibromyalgia and chronic pain syndrome and she also mentions a diagnosis of spinal stenosis and pain in abdomen and ribs related to endometriosis.  Pt had a hysterectomy in 2011.   Current medications:  Tramadol 50mg 1t po q6hprn; finacea 15% cream AAA BID; zyrtec 10mg 1t poqd;  viatmin D3 50,000IU 1 cap qw; cyclobenzaprine 10mg 1t po tid prn muscle spasms; restasis 0.05% 1drop bid; diazepam 10mg 1t po 15h as needed for anxiety; armour thyroid 60mg 2.5 tablets by mouth daily; meloxicam 7.5mg 1t po bid; hydrocodone/APAP 5/500mg 1t po 16h as neede for pain  Per patient doesn't like to take medication and she only uses pain medications, muscle relaxer and anxiety medications when needed. Previous medications tried for fibromyalgia / pain include:  savella - caused severe nausea and extreme weight loss cymbalta - caused severe nausea and weight loss, mental status changes Spinal epidural - patient felt this helped in past but missed last appointment but to transportation problems because office is in [place]. Brand Norco - patient states she took this after her hysterectomy in 2011 and that it worked well and did not cause itching and anxiousness which she reports with her current hydrocodone/apap 5/500mg.  Reviewed notes from Dr. McGough Reviewed [place] Controlled Drug Report for patient  for last 6 months - important to note that Ms. Tudor has not had hydrocodone/APAP 5/500 filled since 11/2011 when she got #90 - per patient she does not take this medication often due to how it makes her feel.  She has had diazepam fill regularly.  Location of Pain: all over but worse in back and legs Describes pain as dull, aching, constant pain Current pain score 4/10 Worse pain score in last 14 days 8/10 Best pain score in last 14 days 4/10  Activity - very little.  Per patient she is unable to do much housework and avoids physical activity.   Assessment: Chronic pain syndrome Fibromyalgia Medication review  Plan: 1. Reviewed our pain contract with patient and answered any questions.  Patient signed contract.  2.  Urine drug screen checked today - pending 3.  Continue current medications until results from drug screen available.  Will then consider alternatives to current therapy if needed. 4.  Encouraged patient to consider referral to Dr. O'Toole for spinal epidural as this has helped in past and his office is closer.   5.  Recommended [employer] program for fibromyalgia / water classes.    

## 2012-07-06 LAB — PRESCRIPTION ABUSE MONITORING 17P, URINE
Amphetamine/Meth: NEGATIVE ng/mL
Barbiturate Screen, Urine: NEGATIVE ng/mL
Buprenorphine, Urine: NEGATIVE ng/mL
Cannabinoid Scrn, Ur: NEGATIVE ng/mL
Carisoprodol, Urine: NEGATIVE ng/mL
Fentanyl, Ur: NEGATIVE ng/mL
Meperidine, Ur: NEGATIVE ng/mL
Tapentadol, urine: NEGATIVE ng/mL

## 2012-07-09 LAB — TRAMADOL, URINE: N-DESMETHYL-CIS-TRAMADOL: 193 ng/mL

## 2012-07-09 LAB — BENZODIAZEPINES (GC/LC/MS), URINE
Alprazolam (GC/LC/MS), ur confirm: NEGATIVE ng/mL
Alprazolam metabolite (GC/LC/MS), ur confirm: NEGATIVE ng/mL
Clonazepam metabolite (GC/LC/MS), ur confirm: NEGATIVE ng/mL
Flunitrazepam metabolite (GC/LC/MS), ur confirm: NEGATIVE ng/mL
Lorazepam (GC/LC/MS), ur confirm: NEGATIVE ng/mL

## 2012-07-09 LAB — OPIATES/OPIOIDS (LC/MS-MS)
Heroin (6-AM), UR: NEGATIVE ng/mL
Noroxycodone, Ur: NEGATIVE ng/mL
Oxycodone, ur: NEGATIVE ng/mL
Oxymorphone: NEGATIVE ng/mL

## 2012-07-25 ENCOUNTER — Telehealth: Payer: Self-pay | Admitting: General Practice

## 2012-07-26 ENCOUNTER — Telehealth: Payer: Self-pay | Admitting: *Deleted

## 2012-07-26 ENCOUNTER — Other Ambulatory Visit: Payer: Self-pay | Admitting: General Practice

## 2012-07-26 DIAGNOSIS — S0300XD Dislocation of jaw, unspecified side, subsequent encounter: Secondary | ICD-10-CM

## 2012-07-26 MED ORDER — MELOXICAM 7.5 MG PO TABS: 7.5000 mg | ORAL_TABLET | Freq: Two times a day (BID) | ORAL | Status: DC

## 2012-07-26 MED ORDER — CYCLOBENZAPRINE HCL 10 MG PO TABS: 10.0000 mg | ORAL_TABLET | Freq: Three times a day (TID) | ORAL | Status: DC | PRN

## 2012-07-26 NOTE — Telephone Encounter (Signed)
Patient called yesterday about refill on meds. Requesting Flexeril and Mobic. Uses [facility] in [place]. Please call and let her know if you will refill meds or if she needs to be seen. Thank you

## 2012-07-27 NOTE — Telephone Encounter (Signed)
Both medications refilled 

## 2012-07-27 NOTE — Progress Notes (Signed)
patient aware

## 2012-08-03 NOTE — Telephone Encounter (Signed)
Family will tellpt , scripts sent  To pharmacy.

## 2012-08-06 ENCOUNTER — Telehealth: Payer: Self-pay | Admitting: General Practice

## 2012-08-07 NOTE — Telephone Encounter (Signed)
Mae,  As far as I can see this was called in for two 7.5 pills a day on 07/26/12

## 2012-08-08 ENCOUNTER — Telehealth: Payer: Self-pay | Admitting: General Practice

## 2012-08-08 ENCOUNTER — Other Ambulatory Visit: Payer: Self-pay | Admitting: General Practice

## 2012-08-08 DIAGNOSIS — M1990 Unspecified osteoarthritis, unspecified site: Secondary | ICD-10-CM

## 2012-08-08 MED ORDER — MELOXICAM 15 MG PO TABS: 15.0000 mg | ORAL_TABLET | Freq: Every day | ORAL | Status: DC

## 2012-08-08 NOTE — Telephone Encounter (Signed)
Mae to address

## 2012-08-08 NOTE — Telephone Encounter (Signed)
Attempted to contact patient with out success. 

## 2012-08-08 NOTE — Telephone Encounter (Signed)
Please inform patient that mobic 15mg  sent to [facility]. She only takes one tablet daily. thx

## 2012-08-08 NOTE — Telephone Encounter (Signed)
The patient's mobic was supposed to be uped to 14mg . On 5/8 it was just called in for the 7.5mg . The patient has been without medication for about 6 days now and has been trying to get this straight. The patient was taking two 7.5mg  pills twice daily and she was able to function. Patient states that she is in a lot of pain in multiple area's of her body. Can somebody please call her and let her know the status.

## 2012-08-09 ENCOUNTER — Telehealth: Payer: Self-pay | Admitting: Nurse Practitioner

## 2012-08-09 NOTE — Telephone Encounter (Signed)
Patient states that she is taking and has been taking 15mg  twice daily and it was helping when she had the medication. She states that she has done this therapy in the past and has been fine. She states that there are medication that she cannot take like advil and others.

## 2012-08-09 NOTE — Telephone Encounter (Signed)
Spoke with patient and discussed medication.

## 2012-08-09 NOTE — Telephone Encounter (Signed)
ERROR

## 2012-08-09 NOTE — Telephone Encounter (Signed)
Spoke with patient and discussed mobic, informed that prescription was sent to pharmacy.

## 2012-08-10 ENCOUNTER — Other Ambulatory Visit: Payer: Self-pay

## 2012-08-30 ENCOUNTER — Other Ambulatory Visit: Payer: Medicaid Other

## 2012-08-31 ENCOUNTER — Ambulatory Visit (INDEPENDENT_AMBULATORY_CARE_PROVIDER_SITE_OTHER): Payer: Medicaid Other | Admitting: Family Medicine

## 2012-08-31 ENCOUNTER — Encounter: Payer: Self-pay | Admitting: Family Medicine

## 2012-08-31 ENCOUNTER — Other Ambulatory Visit: Payer: Medicaid Other

## 2012-08-31 ENCOUNTER — Other Ambulatory Visit: Payer: Self-pay | Admitting: General Practice

## 2012-08-31 VITALS — BP 150/106 | HR 93 | Temp 97.9°F | Wt 164.8 lb

## 2012-08-31 DIAGNOSIS — M797 Fibromyalgia: Secondary | ICD-10-CM

## 2012-08-31 DIAGNOSIS — IMO0001 Reserved for inherently not codable concepts without codable children: Secondary | ICD-10-CM

## 2012-08-31 DIAGNOSIS — E559 Vitamin D deficiency, unspecified: Secondary | ICD-10-CM

## 2012-08-31 DIAGNOSIS — I10 Essential (primary) hypertension: Secondary | ICD-10-CM

## 2012-08-31 DIAGNOSIS — S0300XD Dislocation of jaw, unspecified side, subsequent encounter: Secondary | ICD-10-CM

## 2012-08-31 DIAGNOSIS — Z5189 Encounter for other specified aftercare: Secondary | ICD-10-CM

## 2012-08-31 LAB — CBC
HCT: 38.9 % (ref 36.0–46.0)
Hemoglobin: 13.7 g/dL (ref 12.0–15.0)
MCH: 28.8 pg (ref 26.0–34.0)
MCHC: 35.2 g/dL (ref 30.0–36.0)
MCV: 81.7 fL (ref 78.0–100.0)
Platelets: 243 K/uL (ref 150–400)
RBC: 4.76 MIL/uL (ref 3.87–5.11)
RDW: 12.8 % (ref 11.5–15.5)
WBC: 6.4 K/uL (ref 4.0–10.5)

## 2012-08-31 LAB — COMPREHENSIVE METABOLIC PANEL WITH GFR
ALT: 13 U/L (ref 0–35)
AST: 12 U/L (ref 0–37)
Albumin: 4 g/dL (ref 3.5–5.2)
Alkaline Phosphatase: 57 U/L (ref 39–117)
BUN: 20 mg/dL (ref 6–23)
CO2: 25 meq/L (ref 19–32)
Calcium: 8.6 mg/dL (ref 8.4–10.5)
Chloride: 107 meq/L (ref 96–112)
Creat: 0.5 mg/dL (ref 0.50–1.10)
Glucose, Bld: 102 mg/dL — ABNORMAL HIGH (ref 70–99)
Potassium: 4.5 meq/L (ref 3.5–5.3)
Sodium: 142 meq/L (ref 135–145)
Total Bilirubin: 1.2 mg/dL (ref 0.3–1.2)
Total Protein: 6.3 g/dL (ref 6.0–8.3)

## 2012-08-31 MED ORDER — HYDROCODONE-ACETAMINOPHEN 5-500 MG PO TABS: 1.0000 | ORAL_TABLET | Freq: Four times a day (QID) | ORAL | Status: DC | PRN

## 2012-08-31 NOTE — Progress Notes (Signed)
  Subjective:    Patient ID: Chelsey [name], female    DOB: 10-Jan-1971, 42 y.o.   MRN: [id number]  HPI Patient presents today with chief complaint of jaw pain. Patient with a baseline history of TMJ as well as fibromyalgia. Patient has been placed on multiple rounds of medications including Mobic, prednisone, TCAs. Patient states that she cannot tolerate many these medications secondary side effects including hallucinations, agitation. Patient states she's been trying extra doses of Mobic to help with the jaw pain this is been minimally effective. Patient says she has not been able to eat because of the jaw pain. Has been seen by rheumatology in the past for her fibromyalgia like a referral today. Patient will like a small dose of Vicodin to help with her pain as this has been effective in managing her TMJ flares in the past.   Review of Systems  All other systems reviewed and are negative.       Objective:   Physical Exam  Constitutional: She appears well-developed and well-nourished.  HENT:  Right Ear: External ear normal.  Left Ear: External ear normal.  Positive jaw pain and tenderness to palpation diffusely.   Eyes: Conjunctivae are normal. Pupils are equal, round, and reactive to light.  Neck: Normal range of motion.  Cardiovascular: Normal rate, regular rhythm and normal heart sounds.   Pulmonary/Chest: Effort normal.  Abdominal: Soft.  Musculoskeletal: Normal range of motion.  Neurological: She is alert.  Skin: Skin is warm.          Assessment & Plan:  Fibromyalgia - Plan: Ambulatory referral to Rheumatology  TMJ (dislocation of temporomandibular joint), subsequent encounter - Plan: HYDROcodone-acetaminophen (VICODIN) 5-500 MG per tablet  HTN (hypertension) - Plan: CBC, Comprehensive metabolic panel  Unspecified vitamin D deficiency - Plan: Vitamin D 25 hydroxy   Plan as above: Patient requested an extended dose of Vicodin. However, will give patient a  limited dose of this as there is some concern for abuse. #10 tablets of Vicodin 7.5/325 given. Patient is pending rheumatology followup. The patient desires extended courses of narcotics patient will need to be on a pain contract versus pain clinic referral.

## 2012-09-04 ENCOUNTER — Other Ambulatory Visit: Payer: Self-pay | Admitting: Obstetrics and Gynecology

## 2012-09-07 ENCOUNTER — Ambulatory Visit (INDEPENDENT_AMBULATORY_CARE_PROVIDER_SITE_OTHER): Payer: Medicaid Other | Admitting: Physician Assistant

## 2012-09-07 ENCOUNTER — Encounter: Payer: Self-pay | Admitting: Physician Assistant

## 2012-09-07 ENCOUNTER — Other Ambulatory Visit: Payer: Self-pay | Admitting: Obstetrics and Gynecology

## 2012-09-07 VITALS — BP 160/94 | HR 90 | Temp 97.8°F | Wt 166.0 lb

## 2012-09-07 DIAGNOSIS — M26629 Arthralgia of temporomandibular joint, unspecified side: Secondary | ICD-10-CM

## 2012-09-07 DIAGNOSIS — B373 Candidiasis of vulva and vagina: Secondary | ICD-10-CM

## 2012-09-07 MED ORDER — NORCO 7.5-325 MG PO TABS: 1.0000 | ORAL_TABLET | Freq: Four times a day (QID) | ORAL | Status: DC | PRN

## 2012-09-07 MED ORDER — HYDROCODONE-ACETAMINOPHEN 7.5-325 MG PO TABS: 1.0000 | ORAL_TABLET | Freq: Four times a day (QID) | ORAL | Status: DC | PRN

## 2012-09-07 NOTE — Progress Notes (Signed)
Subjective:     Patient ID: Chelsey [name], female   DOB: [birth year], 41 y.o.   MRN: [id number]  HPI Pt seen last week for TMJ/fibromyalgia At that time she was started on Flexeril and given 10 Norco 7.5 She returns today because she had SE on Flexeril Also the generic Norco gives her pruritus and would like name brand She has appt wit Rheum next Wed   Review of Systems  All other systems reviewed and are negative.       Objective:   Physical Exam  Nursing note and vitals reviewed.  PE deferred Pt showed generic Norco to me and counted 8 pills left    Assessment:     Medicine rxn     Plan:     D/C Flexeril She asked about Valium and I told her I would not rx this today Changed Norco to name brand and told her future rf would have to come from Rheum or pain mgt Keep appt Wed  F/U prn

## 2012-09-07 NOTE — Patient Instructions (Signed)
Temporomandibular Joint Pain  Your exam shows that you have a problem with your temporomandibular joint (TMJ), the joint that moves when you open your mouth or chew food. TMJ problems can result from direct injuries, bite abnormalities, or tension states which cause you to grind or clench your teeth. Typical symptoms include pain around the joint, clicking, restricted movement, and headaches.  The TMJ is like any other joint in the body; when it is strained, it needs rest to repair itself. To keep the joint at rest it is important that you do not open your mouth wider than the width of your index finger. If you must yawn, be sure to support your chin with your hand so your mouth does not open wide. Eat a soft diet (nothing firmer than ground beef, no raw vegetables), do not chew gum and do not talk if it causes you pain.  Apply topical heat by using a warm, moist cloth placed in front of the ear for 15 to 20 minutes several times daily. Alternating heat and ice may give even more relief. Anti-inflammatory pain medicine and muscle relaxants can also be helpful. A dental orthotic or splint may be used for temporary relief. Long-term problems may require treatment for stress as well as braces or surgery. Please check with your doctor or dentist if your symptoms do not improve within one week.  Document Released: 04/14/2004 Document Revised: 05/30/2011 Document Reviewed: 03/07/2005  [employer] Patient Information 2014 [employer].

## 2012-09-10 ENCOUNTER — Telehealth: Payer: Self-pay | Admitting: *Deleted

## 2012-09-10 ENCOUNTER — Other Ambulatory Visit: Payer: Self-pay | Admitting: *Deleted

## 2012-09-10 ENCOUNTER — Telehealth: Payer: Self-pay | Admitting: Physician Assistant

## 2012-09-10 MED ORDER — DIAZEPAM 10 MG PO TABS: ORAL_TABLET | ORAL | Status: DC

## 2012-09-10 NOTE — Telephone Encounter (Signed)
Done via another encounter

## 2012-09-10 NOTE — Telephone Encounter (Signed)
LAST RF 06/12/12. CALL [facility] [phone]. LAST OV 09/07/12.

## 2012-09-10 NOTE — Telephone Encounter (Addendum)
Pt states still has an yeast infection after taking diflucan, requesting refill for diflucan for 9 tablets, take 2 one day, 2 next day, 2 again after 7 days.  Will be seen at next flareup for confirmation of dx.

## 2012-09-11 NOTE — Telephone Encounter (Signed)
Pt states when she takes the generic she has pruritus so she has to have name brand Pls inform pt ins may still not cover and she might have to pay out of pocket

## 2012-09-17 ENCOUNTER — Ambulatory Visit: Payer: Medicaid Other | Admitting: General Practice

## 2012-09-17 ENCOUNTER — Other Ambulatory Visit: Payer: Self-pay

## 2012-09-17 NOTE — Telephone Encounter (Signed)
I informed pt at appt I would not fill rx She will need to get from Rheum or pain mgt

## 2012-09-17 NOTE — Telephone Encounter (Signed)
Filled 6/23 14  Last seen 09/07/12  [facility]  If approved call in and have nurse notify patient

## 2012-09-18 ENCOUNTER — Telehealth: Payer: Self-pay | Admitting: Obstetrics and Gynecology

## 2012-09-18 ENCOUNTER — Telehealth: Payer: Self-pay | Admitting: Physician Assistant

## 2012-09-18 DIAGNOSIS — M2550 Pain in unspecified joint: Secondary | ICD-10-CM

## 2012-09-18 NOTE — Telephone Encounter (Signed)
Dr. Emelda Fear contacted pt by phone

## 2012-09-18 NOTE — Telephone Encounter (Signed)
I do not know why she needs a referral if she already has appt

## 2012-09-18 NOTE — Telephone Encounter (Signed)
Patient agrees to be seen by appt to confirm dx of yeast. We will need to do cultures of yeast when pt seen. Also, pt is taking Diflucan from [facility] with resolution of sx.

## 2012-09-27 ENCOUNTER — Encounter: Payer: Self-pay | Admitting: Pharmacist

## 2012-09-27 ENCOUNTER — Ambulatory Visit: Payer: Medicaid Other | Admitting: Family Medicine

## 2012-09-28 ENCOUNTER — Telehealth: Payer: Self-pay | Admitting: Nurse Practitioner

## 2012-09-28 ENCOUNTER — Ambulatory Visit: Payer: Self-pay | Admitting: Family Medicine

## 2012-09-28 NOTE — Telephone Encounter (Signed)
Patient states that note needs to be faxed to [facility] to let them know when her appt is and that it could not be changed. They normally require a 3 day notice on transportation but she had to come in before then. Faxed note stating that appt could not be changed

## 2012-10-01 ENCOUNTER — Ambulatory Visit (INDEPENDENT_AMBULATORY_CARE_PROVIDER_SITE_OTHER): Payer: Medicaid Other | Admitting: Pharmacist

## 2012-10-01 ENCOUNTER — Encounter: Payer: Self-pay | Admitting: Pharmacist

## 2012-10-01 VITALS — BP 142/90 | HR 78 | Ht 64.5 in | Wt 166.0 lb

## 2012-10-01 DIAGNOSIS — G894 Chronic pain syndrome: Secondary | ICD-10-CM

## 2012-10-01 DIAGNOSIS — Z5189 Encounter for other specified aftercare: Secondary | ICD-10-CM

## 2012-10-01 DIAGNOSIS — IMO0001 Reserved for inherently not codable concepts without codable children: Secondary | ICD-10-CM

## 2012-10-01 DIAGNOSIS — S0300XD Dislocation of jaw, unspecified side, subsequent encounter: Secondary | ICD-10-CM

## 2012-10-01 DIAGNOSIS — M797 Fibromyalgia: Secondary | ICD-10-CM

## 2012-10-01 MED ORDER — DIAZEPAM 10 MG PO TABS: ORAL_TABLET | ORAL | Status: DC

## 2012-10-01 NOTE — Progress Notes (Signed)
Patient ID: Chelsey [name], female   DOB: 22-Aug-1970, 42 y.o.   MRN: [id number]    Filed Vitals:   10/01/12 1731  BP: 142/90  Pulse: 78    Filed Weights   10/01/12 1731  Weight: 166 lb (75.297 kg)   Body mass index is 28.06 kg/(m^2).  HPI:  Pt initially referred by Ruthell Rummage, NP for medication management with specific attention to patient's pain management regimen.   I last saw patient April 2014.  She has since been evaluated in our office for acute TMJ pain by Dr. Alvester Morin and our PA, Montey Hora.   She has also seen several specialists:  Rheumatologist - Dr Kathi Ludwig at [facility].  Dr Kathi Ludwig is performing a work up to r/o other causes of chronic pain and inflammation.  Dr Kathi Ludwig also would like patient referred to a oral maxillofacial surgeon to biopsy to r/o Sjogren syndrome.  Orthopedist - patient has appt this Thursday with [facility] of epidural steroid injection.    She has been diagnosed with fibromyalgia and chronic pain syndrome and she also mentions a diagnosis of spinal stenosis and pain in abdomen and ribs related to endometriosis.  Pt had a hysterectomy in 2011.   Current medications:  Tramadol 50mg  1t po q6hprn (per bottle brought in last filled 05/12/12 #120 1tpoqid and still has about 60 tablets left in bottle) ; finacea 15% cream AAA BID; zyrtec 10mg  1t poqd;  viatmin D3 50,000IU 1 cap qw; cyclobenzaprine 10mg  1t po tid prn muscle spasms(though she recently had side effects to this medication in June 2014),  restasis 0.05% 1drop bid; diazepam 10mg  1/2 to 1 tablet qh as needed for anxiety/sleeplessness (last filled #30 06/12/2012 - no medication in bottle) ; armour thyroid 60mg  2.5 tablets by mouth daily; meloxicam 15mg  1t po qd; hydrocodone/APAP 7.5/500mg  1t po q6h as neede for pain (last filled #10 on 09/04/12 and has 3 tablets left in bottle)  Per patient doesn't like to take medication and she only uses pain medications, muscle relaxer and anxiety medications when  needed. Previous medications tried for fibromyalgia / pain include:  savella - caused severe nausea and extreme weight loss cymbalta - caused severe nausea and weight loss, mental status changes Spinal epidural - patient felt this helped in past but missed last appointment but to transportation problems because office is in [place]. Brand Norco - patient states she took this after her hysterectomy in 2011 and that it worked well and did not cause itching and anxiousness which she reports with generic hydrocodone/APAP.  She is currently taking generic because the Brand Medically necessary rx written by Elmer Picker was denied PA through [facility]  Reviewed past office notes  Reviewed [place] Controlled Drug Report for patient for last 12 months   Location of Pain: all over but worse in neck and jaw Describes pain as dull, aching, constant pain Current pain score 6/10 Worse pain score in last 14 days 8/10 Best pain score in last 14 days 5/10   Assessment: Chronic pain syndrome Fibromyalgia Medication review  Plan: 1. Reviewed our pain policies with patient.  I explained that we need to find a primary provider that she can establish a relationship with.  Will discuss with providers. 2.  Paperwork filled out to get records from Dr. Kathi Ludwig. 3.  Continue current medications for now.  Patient already has Rx for #10 Norco 7.5/325mg  that she can have filled  Discussed patient with Dr Rudi Heap.  Ok's diazepam 10mg  1/2 to  1 tablet at bedtime as needed for sleeplessness/anxiety #34/no refills. 4.  Referral sent for Oral Maxillofacial Surgeon for evaluation of jaw pain and biopsy for r/o Sjogern Syndrome 5.  We also referred patient to be evaluated by [facility] to see if there are any resources such as [employer] membership/prescription copay assistance they might be able to help with.

## 2012-10-03 ENCOUNTER — Telehealth: Payer: Self-pay | Admitting: Obstetrics and Gynecology

## 2012-10-03 DIAGNOSIS — B373 Candidiasis of vulva and vagina: Secondary | ICD-10-CM

## 2012-10-04 MED ORDER — FLUCONAZOLE 150 MG PO TABS: 150.0000 mg | ORAL_TABLET | Freq: Every day | ORAL | Status: DC

## 2012-10-04 NOTE — Telephone Encounter (Signed)
rx changed to once daily x 1 week ,given 3 refils. Pt was to have been seen to confirm dx, but unable to keep appt

## 2012-10-09 ENCOUNTER — Telehealth: Payer: Self-pay

## 2012-10-09 NOTE — Telephone Encounter (Signed)
Will start taking Medicaid for Oral surgery in September.  Referrals is holding referral till 11/19/12 to try and schedule her for jaw BX for Sjogren Syndrome

## 2012-10-15 NOTE — Telephone Encounter (Signed)
Left message with Dr Clarise Cruz office about parotid gland biopsy

## 2012-10-15 NOTE — Telephone Encounter (Signed)
Called to let patient know that we have found someone who will start accepting [facility] September 1st and our referral department will call then to make appt for TMJ evaluation.  Also will contact Dr Kathi Ludwig to let her know - maybe she knows who patient could see for parotid gland biopsy to r/o Sorjorn's syndrome

## 2012-10-16 ENCOUNTER — Other Ambulatory Visit: Payer: Self-pay | Admitting: Obstetrics and Gynecology

## 2012-10-17 NOTE — Telephone Encounter (Signed)
Per message left on VM by Dr Kathi Ludwig - OK to wait for parotid / salivary gland biopsy until can see oral surgeon in September 2014.

## 2012-10-17 NOTE — Telephone Encounter (Signed)
Patient notified for Dr. Clarise Cruz advise.

## 2012-11-05 ENCOUNTER — Ambulatory Visit (INDEPENDENT_AMBULATORY_CARE_PROVIDER_SITE_OTHER): Payer: Medicaid Other | Admitting: Nurse Practitioner

## 2012-11-05 ENCOUNTER — Encounter: Payer: Self-pay | Admitting: Nurse Practitioner

## 2012-11-05 VITALS — BP 136/95 | HR 92 | Temp 98.4°F | Ht 64.0 in | Wt 172.0 lb

## 2012-11-05 DIAGNOSIS — R1013 Epigastric pain: Secondary | ICD-10-CM

## 2012-11-05 DIAGNOSIS — K589 Irritable bowel syndrome without diarrhea: Secondary | ICD-10-CM

## 2012-11-05 NOTE — Progress Notes (Signed)
  Subjective:    Patient ID: Chelsey [name], female    DOB: 1970-06-03, 41 y.o.   MRN: [id number]  HPI Patient in C/o stomach issues- Was told that she has irritable bowel- has never seen specialist-alternating diarrhea and constipation and cramping- But now she is having a lot of upper abdominal pain- hurts up around back. Not belching. No nausea- Pain usually occurs after eating- no pain when hungry. Never has heartburn.    Review of Systems  Constitutional: Positive for unexpected weight change (10 lbs but due to diarrhea). Negative for fever, activity change, appetite change and fatigue.  HENT: Negative.   Eyes: Negative.   Respiratory: Negative.   Cardiovascular: Negative.   Gastrointestinal: Positive for abdominal pain, diarrhea and constipation. Negative for nausea and vomiting.  Musculoskeletal: Negative.   Skin: Negative.   Hematological: Negative.   Psychiatric/Behavioral: Negative.        Objective:   Physical Exam  Constitutional: She appears well-developed and well-nourished.  Cardiovascular: Normal rate, regular rhythm and normal heart sounds.   Pulmonary/Chest: Effort normal and breath sounds normal.  Abdominal: Soft. Bowel sounds are normal. There is tenderness (mild diffuse).  Skin: Skin is warm.    BP 136/95  Pulse 92  Temp(Src) 98.4 F (36.9 C) (Oral)  Ht 5\' 4"  (1.626 m)  Wt 172 lb (78.019 kg)  BMI 29.51 kg/m2  LMP 03/21/2009       Assessment & Plan:   1. IBS (irritable bowel syndrome)   2. Abdominal pain, epigastric    Referral to [facility] Watch spicy and fatty foods  Mary-Margaret Daphine Deutscher, FNP

## 2012-11-05 NOTE — Patient Instructions (Addendum)
Irritable Bowel Syndrome °Irritable Bowel Syndrome (IBS) is caused by a disturbance of normal bowel function. Other terms used are spastic colon, mucous colitis, and irritable colon. It does not require surgery, nor does it lead to cancer. There is no cure for IBS. But with proper diet, stress reduction, and medication, you will find that your problems (symptoms) will gradually disappear or improve. IBS is a common digestive disorder. It usually appears in late adolescence or early adulthood. Women develop it twice as often as men. °CAUSES  °After food has been digested and absorbed in the small intestine, waste material is moved into the colon (large intestine). In the colon, water and salts are absorbed from the undigested products coming from the small intestine. The remaining residue, or fecal material, is held for elimination. Under normal circumstances, gentle, rhythmic contractions on the bowel walls push the fecal material along the colon towards the rectum. In IBS, however, these contractions are irregular and poorly coordinated. The fecal material is either retained too long, resulting in constipation, or expelled too soon, producing diarrhea. °SYMPTOMS  °The most common symptom of IBS is pain. It is typically in the lower left side of the belly (abdomen). But it may occur anywhere in the abdomen. It can be felt as heartburn, backache, or even as a dull pain in the arms or shoulders. The pain comes from excessive bowel-muscle spasms and from the buildup of gas and fecal material in the colon. This pain: °· Can range from sharp belly (abdominal) cramps to a dull, continuous ache. °· Usually worsens soon after eating. °· Is typically relieved by having a bowel movement or passing gas. °Abdominal pain is usually accompanied by constipation. But it may also produce diarrhea. The diarrhea typically occurs right after a meal or upon arising in the morning. The stools are typically soft and watery. They are often  flecked with secretions (mucus). °Other symptoms of IBS include: °· Bloating. °· Loss of appetite. °· Heartburn. °· Feeling sick to your stomach (nausea). °· Belching °· Vomiting °· Gas. °IBS may also cause a number of symptoms that are unrelated to the digestive system: °· Fatigue. °· Headaches. °· Anxiety °· Shortness of breath °· Difficulty in concentrating. °· Dizziness. °These symptoms tend to come and go. °DIAGNOSIS  °The symptoms of IBS closely mimic the symptoms of other, more serious digestive disorders. So your caregiver may wish to perform a variety of additional tests to exclude these disorders. He/she wants to be certain of learning what is wrong (diagnosis). The nature and purpose of each test will be explained to you. °TREATMENT °A number of medications are available to help correct bowel function and/or relieve bowel spasms and abdominal pain. Among the drugs available are: °· Mild, non-irritating laxatives for severe constipation and to help restore normal bowel habits. °· Specific anti-diarrheal medications to treat severe or prolonged diarrhea. °· Anti-spasmodic agents to relieve intestinal cramps. °· Your caregiver may also decide to treat you with a mild tranquilizer or sedative during unusually stressful periods in your life. °The important thing to remember is that if any drug is prescribed for you, make sure that you take it exactly as directed. Make sure that your caregiver knows how well it worked for you. °HOME CARE INSTRUCTIONS  °· Avoid foods that are high in fat or oils. Some examples are:heavy cream, butter, frankfurters, sausage, and other fatty meats. °· Avoid foods that have a laxative effect, such as fruit, fruit juice, and dairy products. °· Cut out   carbonated drinks, chewing gum, and "gassy" foods, such as beans and cabbage. This may help relieve bloating and belching. °· Bran taken with plenty of liquids may help relieve constipation. °· Keep track of what foods seem to trigger  your symptoms. °· Avoid emotionally charged situations or circumstances that produce anxiety. °· Start or continue exercising. °· Get plenty of rest and sleep. °MAKE SURE YOU:  °· Understand these instructions. °· Will watch your condition. °· Will get help right away if you are not doing well or get worse. °Document Released: 03/07/2005 Document Revised: 05/30/2011 Document Reviewed: 10/26/2007 °ExitCare® Patient Information ©2014 [employer]. ° °

## 2012-11-06 ENCOUNTER — Encounter: Payer: Self-pay | Admitting: Internal Medicine

## 2012-11-07 ENCOUNTER — Ambulatory Visit (INDEPENDENT_AMBULATORY_CARE_PROVIDER_SITE_OTHER): Payer: Medicaid Other | Admitting: Obstetrics and Gynecology

## 2012-11-07 ENCOUNTER — Encounter: Payer: Self-pay | Admitting: Obstetrics and Gynecology

## 2012-11-07 VITALS — BP 142/84 | Ht 64.5 in | Wt 172.2 lb

## 2012-11-07 DIAGNOSIS — Z01419 Encounter for gynecological examination (general) (routine) without abnormal findings: Secondary | ICD-10-CM

## 2012-11-07 DIAGNOSIS — Z1212 Encounter for screening for malignant neoplasm of rectum: Secondary | ICD-10-CM

## 2012-11-07 DIAGNOSIS — Z0000 Encounter for general adult medical examination without abnormal findings: Secondary | ICD-10-CM

## 2012-11-07 LAB — HEMOCCULT GUIAC POC 1CARD (OFFICE)

## 2012-11-07 NOTE — Progress Notes (Signed)
  Assessment:  Annual Gyn Exam normal exam today,. "Fibro" flareup Hx chronic vulvar discomfort, tx'd as yeast many times c 8 pill (?) regimen, had relief, then at beach->vulvar discomfort and anal discomfort.   Plan:  1. Pt to see "gastro" soon  2. return annually or prn 3    Annual mammogram advised Subjective:  Chelsey [name] is a 42 y.o. female No obstetric history on file. who presents for annual exam. Patient's last menstrual period was 03/21/2009. The patient has complaints today of vague complaints. 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  Review of Systems Pt sighs with every quiestion then answers Constitutional:  Gastrointestinal: negative Genitourinary: negative  Objective:  BP 142/84  Ht 5' 4.5" (1.638 m)  Wt 172 lb 3.2 oz (78.109 kg)  BMI 29.11 kg/m2  LMP 03/21/2009   BMI: Body mass index is 29.11 kg/(m^2).  General Appearance: Alert, appropriate appearance for age. No acute distress HEENT: Grossly normal Neck / Thyroid:  Cardiovascular: RRR; normal S1, S2, no murmur Lungs: CTA bilaterally Back: No CVAT Breast Exam: No dimpling, nipple retraction or discharge. No masses or nodes. and No masses or nodes.No dimpling, nipple retraction or discharge. Reportedly has "class 4 breast density" Gastrointestinal: Soft, non-tender, no masses or organomegaly Pelvic Exam: External genitalia: normal general appearance Vaginal: normal mucosa without prolapse or lesions Cervix: absent Rectovaginal: guaiac negative stool obtained Lymphatic Exam: Non-palpable nodes in neck, clavicular, axillary, or inguinal regions Skin: no rash or abnormalities Neurologic: Normal gait and speech, no tremor   Psychiatric: Alert and oriented, appropriate affect.  Urinalysis:normal and Not done  Christin Bach. MD Pgr [phone] 3:05 PM    a

## 2012-11-07 NOTE — Patient Instructions (Signed)
Keep pain/syptom calendar No gyn complaints at present

## 2012-11-09 ENCOUNTER — Ambulatory Visit: Payer: Medicaid Other | Admitting: Internal Medicine

## 2012-11-29 ENCOUNTER — Other Ambulatory Visit: Payer: Self-pay | Admitting: Nurse Practitioner

## 2012-11-29 MED ORDER — TRAMADOL HCL 50 MG PO TABS: 50.0000 mg | ORAL_TABLET | Freq: Four times a day (QID) | ORAL | Status: DC | PRN

## 2012-11-29 MED ORDER — DIAZEPAM 10 MG PO TABS: ORAL_TABLET | ORAL | Status: DC

## 2012-11-29 NOTE — Telephone Encounter (Signed)
Tramadol last filled 10/10/11, does she even need?

## 2012-11-29 NOTE — Telephone Encounter (Signed)
rx ready for pickup 

## 2012-11-29 NOTE — Telephone Encounter (Signed)
Last filled 10/01/12, last seen 11/05/12. If approved route to pool B, so it can be called into [place] [phone]

## 2012-11-30 ENCOUNTER — Encounter: Payer: Self-pay | Admitting: Internal Medicine

## 2012-11-30 ENCOUNTER — Ambulatory Visit (INDEPENDENT_AMBULATORY_CARE_PROVIDER_SITE_OTHER): Payer: Medicaid Other | Admitting: Internal Medicine

## 2012-11-30 VITALS — BP 118/78 | HR 68 | Ht 63.5 in | Wt 176.5 lb

## 2012-11-30 DIAGNOSIS — R1013 Epigastric pain: Secondary | ICD-10-CM

## 2012-11-30 DIAGNOSIS — K589 Irritable bowel syndrome without diarrhea: Secondary | ICD-10-CM

## 2012-11-30 DIAGNOSIS — K625 Hemorrhage of anus and rectum: Secondary | ICD-10-CM

## 2012-11-30 MED ORDER — MOVIPREP 100 G PO SOLR: 1.0000 | Freq: Once | ORAL | Status: DC

## 2012-11-30 NOTE — Progress Notes (Signed)
HISTORY OF PRESENT ILLNESS:  Chelsey [name] is a 42 y.o. female with fibromyalgia, irritable bowel syndrome, anxiety, and hypothyroidism who is referred by her primary provider regarding vague abdominal discomfort and minor rectal bleeding. The patient is new to this office. She reports to me a one-year history of a upper abdominal aching discomfort which she believes originates in her back. When asked about intensity, duration, frequency, exacerbating factors, relieving factors, she states "I'm not sure". She takes pain medication for her fibromyalgia. Also back pain. She reports a greater than 10 year history of constipation alternating with diarrhea. As well abdominal bloating. 10 pound weight gain over the past year. Recurrent isolated rectal bleeding generally manifested as blood on the tissue. She reports rectal examination with her gynecologist was negative without hemorrhoids. No family history of gastrointestinal malignancy. Occasional nausea. Review of outside records shows a barium esophagram 2009 for dysphagia being unremarkable. Also, Hemoccult studies from August 2014 were normal. Blood work from June 2014 reveals unremarkable comprehensive metabolic panel and CBC.  REVIEW OF SYSTEMS:  All non-GI ROS negative except for anxiety, arthritis, asthma, sinus and allergy, back pain, visual change, depression, fatigue, headaches, itching, muscle cramps, sleeping problems, excessive thirst,  Past Medical History  Diagnosis Date  . Hypothyroidism   . Anemia   . Allergy   . Fibromyalgia   . Stomach pain   . IBS (irritable bowel syndrome)   . Anxiety   . Asthma   . Arthritis   . Sjogren's disease   . Cervical cancer   . Thyroid cancer     ?  Marland Kitchen Hashimoto's disease   . Depression   . Pneumonia   . Endometriosis     Past Surgical History  Procedure Laterality Date  . Cyst removed to uterus    . Miscarriage  2  . Ablation    . Thyroidectomy    . Abdominal hysterectomy    .  Ovarian cyst surgery      x 2  . Throat surgery      or cyst and tumors    Social History Chelsey [name]  reports that she has never smoked. She has never used smokeless tobacco. She reports that  drinks alcohol. She reports that she does not use illicit drugs.  family history includes Alcohol abuse in her father; Diabetes in her brother; Fibromyalgia in her mother; Hypertension in her sister; Thyroid nodules in her mother.  Allergies  Allergen Reactions  . Augmentin [Amoxicillin-Pot Clavulanate] Diarrhea  . Celebrex [Celecoxib] Hives  . Cymbalta [Duloxetine Hcl]     Extreme nausea and weight loss  . Flexeril [Cyclobenzaprine]     Tremors, anxiety, and eye twitching  . Prednisone Other (See Comments)    hallucinations  . Savella [Milnacipran Hcl]     Caused extreme nausea and weight loss.         PHYSICAL EXAMINATION: Vital signs: BP 118/78  Pulse 68  Ht 5' 3.5" (1.613 m)  Wt 176 lb 8 oz (80.06 kg)  BMI 30.77 kg/m2  LMP 03/21/2009  Constitutional: Obese, obese, generally well-appearing, no acute distress Psychiatric: alert and oriented x3, cooperative Eyes: extraocular movements intact, anicteric, conjunctiva pink Mouth: oral pharynx moist, no lesions Neck: supple no lymphadenopathy Cardiovascular: heart regular rate and rhythm, no murmur Lungs: clear to auscultation bilaterally Abdomen: soft, nontender, nondistended, no obvious ascites, no peritoneal signs, normal bowel sounds, no organomegaly Rectal: Deferred until colonoscopy Extremities: no lower extremity edema bilaterally Skin: Heavily tattooed throughout most of the  body, no additional lesions on visible extremities Neuro: No focal deficits.  ASSESSMENT:  #1. Vague chronic upper abdominal discomfort of uncertain etiology. No alarm features. Rule out ulcer given NSAID use. Rule out gallbladder disease. Possibly functional #2. Recurrent minor intermittent rectal bleeding. Suspect internal hemorrhoid. #3.  Multiple medical problems #4. Chronic IBS   PLAN:  #1. Abdominal ultrasound rule out gallstones #2. Diagnostic upper endoscopy.The nature of the procedure, as well as the risks, benefits, and alternatives were carefully and thoroughly reviewed with the patient. Ample time for discussion and questions allowed. The patient understood, was satisfied, and agreed to proceed. #3. Diagnostic colonoscopy.The nature of the procedure, as well as the risks, benefits, and alternatives were carefully and thoroughly reviewed with the patient. Ample time for discussion and questions allowed. The patient understood, was satisfied, and agreed to proceed. #4. If the above revealing, then treat accordingly. If unremarkable, then returned to primary care provider.

## 2012-11-30 NOTE — Patient Instructions (Addendum)
You have been scheduled for an abdominal ultrasound at [facility] (1st floor of hospital) on 12/05/2012 at 9:00am. Please arrive 15 minutes prior to your appointment for registration. Make certain not to have anything to eat or drink after midnight prior to your appointment. Should you need to reschedule your appointment, please contact radiology at [phone]. This test typically takes about 30 minutes to perform.    You have been scheduled for an endoscopy and colonoscopy with propofol. Please follow the written instructions given to you at your visit today. Please pick up your prep at the pharmacy within the next 1-3 days.  If you use inhalers (even only as needed), please bring them with you on the day of your procedure.  Your physician has requested that you go to www.startemmi.com and enter the access code given to you at your visit today. This web site gives a general overview about your procedure. However, you should still follow specific instructions given to you by our office regarding your preparation for the procedure.

## 2012-12-04 ENCOUNTER — Telehealth: Payer: Self-pay | Admitting: Nurse Practitioner

## 2012-12-04 NOTE — Telephone Encounter (Signed)
error 

## 2012-12-05 ENCOUNTER — Telehealth: Payer: Self-pay | Admitting: Nurse Practitioner

## 2012-12-05 ENCOUNTER — Ambulatory Visit (HOSPITAL_COMMUNITY)
Admission: RE | Admit: 2012-12-05 | Discharge: 2012-12-05 | Disposition: A | Discharge status: Home / Self Care | Payer: Medicaid Other | Source: Ambulatory Visit | Attending: Internal Medicine | Admitting: Internal Medicine

## 2012-12-05 DIAGNOSIS — K625 Hemorrhage of anus and rectum: Secondary | ICD-10-CM

## 2012-12-05 DIAGNOSIS — R1013 Epigastric pain: Secondary | ICD-10-CM

## 2012-12-05 DIAGNOSIS — K589 Irritable bowel syndrome without diarrhea: Secondary | ICD-10-CM

## 2012-12-05 DIAGNOSIS — R109 Unspecified abdominal pain: Secondary | ICD-10-CM | POA: Insufficient documentation

## 2012-12-07 NOTE — Telephone Encounter (Signed)
Pt notified she will need to be seen 

## 2012-12-07 NOTE — Telephone Encounter (Signed)
Pt notified she will need to schedule appt

## 2012-12-07 NOTE — Telephone Encounter (Signed)
Cannot fill without being seen 

## 2012-12-10 ENCOUNTER — Telehealth: Payer: Self-pay | Admitting: Nurse Practitioner

## 2012-12-10 NOTE — Telephone Encounter (Signed)
Appt made

## 2012-12-14 ENCOUNTER — Encounter: Payer: Self-pay | Admitting: Nurse Practitioner

## 2012-12-14 ENCOUNTER — Ambulatory Visit (INDEPENDENT_AMBULATORY_CARE_PROVIDER_SITE_OTHER): Payer: Medicaid Other | Admitting: Nurse Practitioner

## 2012-12-14 VITALS — BP 128/88 | HR 82 | Temp 99.6°F | Ht 63.4 in | Wt 172.0 lb

## 2012-12-14 DIAGNOSIS — G8929 Other chronic pain: Secondary | ICD-10-CM

## 2012-12-14 DIAGNOSIS — M1990 Unspecified osteoarthritis, unspecified site: Secondary | ICD-10-CM

## 2012-12-14 DIAGNOSIS — M549 Dorsalgia, unspecified: Secondary | ICD-10-CM

## 2012-12-14 DIAGNOSIS — M129 Arthropathy, unspecified: Secondary | ICD-10-CM

## 2012-12-14 MED ORDER — HYDROCODONE-ACETAMINOPHEN 7.5-325 MG PO TABS: 1.0000 | ORAL_TABLET | Freq: Four times a day (QID) | ORAL | Status: DC | PRN

## 2012-12-14 MED ORDER — MELOXICAM 15 MG PO TABS: 15.0000 mg | ORAL_TABLET | Freq: Every day | ORAL | Status: DC

## 2012-12-14 NOTE — Progress Notes (Addendum)
  Subjective:    Patient ID: Chelsey [name], female    DOB: Jan 30, 1971, 42 y.o.   MRN: [id number]  HPI Pt been seen for chronic lower back pain and bilateral pelvic pain.-Pt needs refill on her pain meds.  Pt to start PT "as soon as possible" per her orthopedic specialist-He has already given her the referral. Pt currently taking several medications for pain-states the pain is always there but they help a lot.    Review of Systems  Musculoskeletal: Positive for back pain.  All other systems reviewed and are negative.       Objective:   Physical Exam  Vitals reviewed. Constitutional: She is oriented to person, place, and time. She appears well-developed and well-nourished.  HENT:  Head: Normocephalic.  Cardiovascular: Normal rate, regular rhythm, normal heart sounds and intact distal pulses.   Pulmonary/Chest: Effort normal and breath sounds normal.  Abdominal: Soft. Bowel sounds are normal. She exhibits no distension. There is no tenderness.  Musculoskeletal: Normal range of motion. She exhibits no edema and no tenderness.  Neurological: She is alert and oriented to person, place, and time.  Skin: Skin is warm and dry.  Psychiatric: She has a normal mood and affect. Her behavior is normal. Judgment and thought content normal.     BP 128/88  Pulse 82  Temp(Src) 99.6 F (37.6 C) (Oral)  Ht 5' 3.4" (1.61 m)  Wt 172 lb (78.019 kg)  BMI 30.1 kg/m2  LMP 03/21/2009      Assessment & Plan:   1. Arthritis   2. Chronic back pain     Meds ordered this encounter  Medications  . HYDROcodone-acetaminophen (NORCO) 7.5-325 MG per tablet    Sig: Take 1 tablet by mouth every 6 (six) hours as needed for pain. Needs namebrand.  Generic causes itching.    Dispense:  40 tablet    Refill:  0    Order Specific Question:  Supervising Provider    Answer:  Ernestina Penna [[id number]]  . meloxicam (MOBIC) 15 MG tablet    Sig: Take 1 tablet (15 mg total) by mouth daily.    Dispense:  30  tablet    Refill:  0    Order Specific Question:  Supervising Provider    Answer:  Deborra Medina    Continue all meds Labs pending Diet and exercise encouraged Health maintenance reviewed Follow up in 3 months Start PT as soon as possible   Mary-Margaret Daphine Deutscher, FNP

## 2012-12-14 NOTE — Patient Instructions (Addendum)

## 2012-12-18 ENCOUNTER — Other Ambulatory Visit: Payer: Self-pay

## 2012-12-18 MED ORDER — OLOPATADINE HCL 0.1 % OP SOLN: 1.0000 [drp] | Freq: Two times a day (BID) | OPHTHALMIC | Status: DC

## 2012-12-25 ENCOUNTER — Ambulatory Visit (AMBULATORY_SURGERY_CENTER): Payer: Medicaid Other | Admitting: Internal Medicine

## 2012-12-25 ENCOUNTER — Encounter: Payer: Self-pay | Admitting: Internal Medicine

## 2012-12-25 VITALS — BP 116/75 | HR 72 | Temp 98.8°F | Resp 49 | Ht 63.5 in | Wt 176.0 lb

## 2012-12-25 DIAGNOSIS — K625 Hemorrhage of anus and rectum: Secondary | ICD-10-CM

## 2012-12-25 DIAGNOSIS — R1013 Epigastric pain: Secondary | ICD-10-CM

## 2012-12-25 MED ORDER — SODIUM CHLORIDE 0.9 % IV SOLN: 500.0000 mL | INTRAVENOUS | Status: DC

## 2012-12-25 NOTE — Op Note (Signed)
[facility] [address] [place], [address]   ENDOSCOPY PROCEDURE REPORT  PATIENT: Chelsey, [name]  MR#: [id number] BIRTHDATE: August 30, 1970 , 42  yrs. old GENDER: Female ENDOSCOPIST: Roxy Cedar, MD REFERRED BY:  Wardell Heath, N.P. PROCEDURE DATE:  12/25/2012 PROCEDURE:  EGD, diagnostic ASA CLASS:     Class II INDICATIONS:  Epigastric pain. MEDICATIONS: MAC sedation, administered by CRNA and propofol (Diprivan) 100mg  IV TOPICAL ANESTHETIC: none  DESCRIPTION OF PROCEDURE: After the risks benefits and alternatives of the procedure were thoroughly explained, informed consent was obtained.  The LB [id number] endoscope was introduced through the mouth and advanced to the second portion of the duodenum. Without limitations.  The instrument was slowly withdrawn as the mucosa was fully examined.      The upper, middle and distal third of the esophagus were carefully inspected and no abnormalities were noted.  The z-line was well seen at the GEJ.  The endoscope was pushed into the fundus which was normal including a retroflexed view.  The antrum, gastric body, first and second part of the duodenum were unremarkable. Retroflexed views revealed no abnormalities.     The scope was then withdrawn from the patient and the procedure completed.  COMPLICATIONS: There were no complications.  ENDOSCOPIC IMPRESSION: 1. Normal EGD 2. No GI cause for complaints found or suspected  RECOMMENDATIONS: 1. Return to the care of your primary provider.  REPEAT EXAM:  eSigned:  Roxy Cedar, MD 12/25/2012 2:37 PM   HQ:IONG Benjamin Stain, NP and The Patient

## 2012-12-25 NOTE — Progress Notes (Signed)
Lidocaine-40mg IV prior to Propofol InductionPropofol given over incremental dosages 

## 2012-12-25 NOTE — Op Note (Signed)
[facility] [address] [place], [address]   COLONOSCOPY PROCEDURE REPORT  PATIENT: Chelsey [name], Chelsey [name]  MR#: [id number] BIRTHDATE: 12/02/1970 , 42  yrs. old GENDER: Female ENDOSCOPIST: Roxy Cedar, MD REFERRED QI:ONGE Sheron Nightingale, N.P. PROCEDURE DATE:  12/25/2012 PROCEDURE:   Colonoscopy, diagnostic First Screening Colonoscopy - Avg.  risk and is 50 yrs.  old or older - No.  Prior Negative Screening - Now for repeat screening. N/A  History of Adenoma - Now for follow-up colonoscopy & has been > or = to 3 yrs.  N/A  Polyps Removed Today? No.  Recommend repeat exam, <10 yrs? No. ASA CLASS:   Class II INDICATIONS:rectal bleeding. MEDICATIONS: MAC sedation, administered by CRNA and propofol (Diprivan) 350mg  IV  DESCRIPTION OF PROCEDURE:   After the risks benefits and alternatives of the procedure were thoroughly explained, informed consent was obtained.  A digital rectal exam revealed no abnormalities of the rectum.   The LB [id number]  endoscope was introduced through the anus and advanced to the cecum, which was identified by both the appendix and ileocecal valve. No adverse events experienced.   The quality of the prep was excellent, using MoviPrep  The instrument was then slowly withdrawn as the colon was fully examined.  COLON FINDINGS: The mucosa appeared normal in the terminal ileum. A normal appearing cecum, ileocecal valve, and appendiceal orifice were identified.  The ascending, hepatic flexure, transverse, splenic flexure, descending, sigmoid colon and rectum appeared unremarkable.  No polyps or cancers were seen.  Retroflexed views revealed internal hemorrhoids. The time to cecum=3 minutes 05 seconds.  Withdrawal time=10 minutes 32 seconds.  The scope was withdrawn and the procedure completed. COMPLICATIONS: There were no complications.  ENDOSCOPIC IMPRESSION: 1.   Normal mucosa in the terminal ileum 2.   Normal  colon  RECOMMENDATIONS: 1.  Continue current colorectal screening recommendations for "routine risk" patients with a repeat colonoscopy in 10 years. 2.  Upper endoscopy today (SEE REPORT)   eSigned:  Roxy Cedar, MD 12/25/2012 2:34 PM   cc: Bennie Pierini, NP and The Patient

## 2012-12-25 NOTE — Patient Instructions (Addendum)
YOU HAD AN ENDOSCOPIC PROCEDURE TODAY AT [facility]: Refer to the procedure report that was given to you for any specific questions about what was found during the examination.  If the procedure report does not answer your questions, please call your gastroenterologist to clarify.  If you requested that your care partner not be given the details of your procedure findings, then the procedure report has been included in a sealed envelope for you to review at your convenience later.  YOU SHOULD EXPECT: Some feelings of bloating in the abdomen. Passage of more gas than usual.  Walking can help get rid of the air that was put into your GI tract during the procedure and reduce the bloating. If you had a lower endoscopy (such as a colonoscopy or flexible sigmoidoscopy) you may notice spotting of blood in your stool or on the toilet paper. If you underwent a bowel prep for your procedure, then you may not have a normal bowel movement for a few days.  DIET: Your first meal following the procedure should be a light meal and then it is ok to progress to your normal diet.  A half-sandwich or bowl of soup is an example of a good first meal.  Heavy or fried foods are harder to digest and may make you feel nauseous or bloated.  Likewise meals heavy in dairy and vegetables can cause extra gas to form and this can also increase the bloating.  Drink plenty of fluids but you should avoid alcoholic beverages for 24 hours.  ACTIVITY: Your care partner should take you home directly after the procedure.  You should plan to take it easy, moving slowly for the rest of the day.  You can resume normal activity the day after the procedure however you should NOT DRIVE or use heavy machinery for 24 hours (because of the sedation medicines used during the test).    SYMPTOMS TO REPORT IMMEDIATELY: A gastroenterologist can be reached at any hour.  During normal business hours, 8:30 AM to 5:00 PM Monday through Friday,  call [phone].  After hours and on weekends, please call the GI answering service at [phone] who will take a message and have the physician on call contact you.   Following lower endoscopy (colonoscopy or flexible sigmoidoscopy):  Excessive amounts of blood in the stool  Significant tenderness or worsening of abdominal pains  Swelling of the abdomen that is new, acute  Fever of 100F or higher  Following upper endoscopy (EGD)  Vomiting of blood or coffee ground material  New chest pain or pain under the shoulder blades  Painful or persistently difficult swallowing  New shortness of breath  Fever of 100F or higher  Black, tarry-looking stools  FOLLOW UP: Our staff will call the home number listed on your records the next business day following your procedure to check on you and address any questions or concerns that you may have at that time regarding the information given to you following your procedure. This is a courtesy call and so if there is no answer at the home number and we have not heard from you through the emergency physician on call, we will assume that you have returned to your regular daily activities without incident.  SIGNATURES/CONFIDENTIALITY: You and/or your care partner have signed paperwork which will be entered into your electronic medical record.  These signatures attest to the fact that that the information above on your After Visit Summary has been reviewed and is  understood.  Full responsibility of the confidentiality of this discharge information lies with you and/or your care-partner.  Please continue your normal medications  Follow up colonoscopy in 10 years  Please return to the care of your primary doctor

## 2012-12-25 NOTE — Progress Notes (Signed)
Patient did not experience any of the following events: a burn prior to discharge; a fall within the facility; wrong site/side/patient/procedure/implant event; or a hospital transfer or hospital admission upon discharge from the facility. (G8907) Patient did not have preoperative order for IV antibiotic SSI prophylaxis. (G8918)  

## 2012-12-26 ENCOUNTER — Telehealth: Payer: Self-pay | Admitting: *Deleted

## 2012-12-26 NOTE — Telephone Encounter (Signed)
Left message that we called for f/u 

## 2013-01-15 ENCOUNTER — Other Ambulatory Visit: Payer: Self-pay | Admitting: Otolaryngology

## 2013-01-17 ENCOUNTER — Telehealth: Payer: Self-pay | Admitting: Nurse Practitioner

## 2013-01-17 ENCOUNTER — Other Ambulatory Visit: Payer: Self-pay | Admitting: Nurse Practitioner

## 2013-01-17 MED ORDER — CLONAZEPAM 1 MG PO TABS: 1.0000 mg | ORAL_TABLET | Freq: Two times a day (BID) | ORAL | Status: DC | PRN

## 2013-01-17 NOTE — Telephone Encounter (Signed)
Patient requesting change from diazepam.  She recently has had a change in family situation - her son was recently at [facility].  Stress level is up and diazepam is making her drowsy.  Changed to clonazepam 1mg  bid.  Patient notified.

## 2013-01-23 ENCOUNTER — Other Ambulatory Visit: Payer: Self-pay | Admitting: Nurse Practitioner

## 2013-01-24 ENCOUNTER — Other Ambulatory Visit: Payer: Self-pay

## 2013-02-07 ENCOUNTER — Telehealth: Payer: Self-pay | Admitting: Nurse Practitioner

## 2013-02-11 NOTE — Telephone Encounter (Signed)
Needs refill on Tramadol, Hydrocodone, Clonazepam, and Mobic. Insurance ends 01/28/13 and you dont have and any appts available. Please advise.

## 2013-02-11 NOTE — Telephone Encounter (Signed)
Please make appointment.

## 2013-02-13 ENCOUNTER — Ambulatory Visit (INDEPENDENT_AMBULATORY_CARE_PROVIDER_SITE_OTHER): Payer: Medicaid Other | Admitting: Family Medicine

## 2013-02-13 ENCOUNTER — Encounter: Payer: Self-pay | Admitting: Family Medicine

## 2013-02-13 VITALS — BP 136/86 | HR 81 | Temp 98.2°F | Ht 63.5 in | Wt 170.0 lb

## 2013-02-13 DIAGNOSIS — F411 Generalized anxiety disorder: Secondary | ICD-10-CM

## 2013-02-13 DIAGNOSIS — G894 Chronic pain syndrome: Secondary | ICD-10-CM

## 2013-02-13 DIAGNOSIS — J069 Acute upper respiratory infection, unspecified: Secondary | ICD-10-CM

## 2013-02-13 MED ORDER — TRAMADOL HCL 50 MG PO TABS: 50.0000 mg | ORAL_TABLET | Freq: Four times a day (QID) | ORAL | Status: DC | PRN

## 2013-02-13 MED ORDER — AZITHROMYCIN 250 MG PO TABS: ORAL_TABLET | ORAL | Status: DC

## 2013-02-13 MED ORDER — HYDROCODONE-ACETAMINOPHEN 7.5-325 MG PO TABS: 1.0000 | ORAL_TABLET | Freq: Four times a day (QID) | ORAL | Status: DC | PRN

## 2013-02-13 MED ORDER — CLONAZEPAM 1 MG PO TABS: 1.0000 mg | ORAL_TABLET | Freq: Two times a day (BID) | ORAL | Status: DC | PRN

## 2013-02-13 NOTE — Progress Notes (Signed)
   Subjective:    Patient ID: Chelsey [name], female    DOB: 1970/05/16, 42 y.o.   MRN: [id number]  HPI This 42 y.o. female presents for evaluation of refills on her pain medicine. She sees Gennette Pac FNP-C and Tammy Eckerd but cannot get appointment. She states she was dx with Lupus by RA.  She is having some URI sx's.   Review of Systems No chest pain, SOB, HA, dizziness, vision change, N/V, diarrhea, constipation, dysuria, urinary urgency or frequency, myalgias, arthralgias or rash.     Objective:   Physical Exam Vital signs noted  Well developed well nourished female.  HEENT - Head atraumatic Normocephalic                Eyes - PERRLA, Conjuctiva - clear Sclera- Clear EOMI                Ears - EAC's Wnl TM's Wnl Gross Hearing WNL                Nose - Nares patent                 Throat - oropharanx wnl Respiratory - Lungs CTA bilateral Cardiac - RRR S1 and S2 without murmur GI - Abdomen soft Nontender and bowel sounds active x 4 Extremities - No edema. Neuro - Grossly intact.       Assessment & Plan:  Chronic pain syndrome - Plan: HYDROcodone-acetaminophen (NORCO) 7.5-325 MG per tablet, traMADol (ULTRAM) 50 MG tablet  URI (upper respiratory infection) - Plan: azithromycin (ZITHROMAX) 250 MG tablet  Generalized anxiety disorder - Plan: clonazePAM (KLONOPIN) 1 MG tablet, DISCONTINUED: clonazePAM (KLONOPIN) 1 MG tablet  Deatra Canter FNP

## 2013-02-13 NOTE — Telephone Encounter (Signed)
Appointment made for Chelsey [name]

## 2013-03-28 ENCOUNTER — Encounter: Payer: Self-pay | Admitting: Nurse Practitioner

## 2013-09-13 ENCOUNTER — Telehealth: Payer: Self-pay | Admitting: Pharmacist

## 2013-09-23 NOTE — Telephone Encounter (Signed)
Patient has been going to free clinic in [place] because she lost Medicaid but the free clinic does not prescribe anything for pain.  She last received pain medications from our office 01/2013.  She would like to make appt with Chevis Pretty but will need to pay for appt so wanted to make sure this was ok with her.  I spoke with MMM and she said she would see her - appt 10/17/13 at 11am. Patient aware

## 2013-10-17 ENCOUNTER — Ambulatory Visit: Payer: Medicaid Other | Admitting: Nurse Practitioner

## 2013-10-17 ENCOUNTER — Encounter: Payer: Self-pay | Admitting: Nurse Practitioner

## 2013-10-17 VITALS — Temp 98.3°F | Ht 63.5 in | Wt 174.8 lb

## 2013-10-17 DIAGNOSIS — N6459 Other signs and symptoms in breast: Secondary | ICD-10-CM

## 2013-10-17 DIAGNOSIS — M797 Fibromyalgia: Secondary | ICD-10-CM

## 2013-10-17 DIAGNOSIS — IMO0001 Reserved for inherently not codable concepts without codable children: Secondary | ICD-10-CM

## 2013-10-17 DIAGNOSIS — S0300XA Dislocation of jaw, unspecified side, initial encounter: Secondary | ICD-10-CM | POA: Insufficient documentation

## 2013-10-17 DIAGNOSIS — G894 Chronic pain syndrome: Secondary | ICD-10-CM

## 2013-10-17 DIAGNOSIS — Z5189 Encounter for other specified aftercare: Secondary | ICD-10-CM

## 2013-10-17 DIAGNOSIS — N6452 Nipple discharge: Secondary | ICD-10-CM

## 2013-10-17 DIAGNOSIS — S0300XD Dislocation of jaw, unspecified side, subsequent encounter: Secondary | ICD-10-CM

## 2013-10-17 MED ORDER — HYDROCODONE-ACETAMINOPHEN 7.5-325 MG PO TABS: 1.0000 | ORAL_TABLET | Freq: Four times a day (QID) | ORAL | Status: DC | PRN

## 2013-10-17 MED ORDER — PREDNISONE 20 MG PO TABS: ORAL_TABLET | ORAL | Status: DC

## 2013-10-17 MED ORDER — TRAMADOL HCL 50 MG PO TABS: 50.0000 mg | ORAL_TABLET | Freq: Four times a day (QID) | ORAL | Status: DC | PRN

## 2013-10-17 MED ORDER — DIAZEPAM 10 MG PO TABS: 10.0000 mg | ORAL_TABLET | Freq: Every evening | ORAL | Status: DC | PRN

## 2013-10-17 MED ORDER — CYCLOBENZAPRINE HCL 10 MG PO TABS: 10.0000 mg | ORAL_TABLET | Freq: Three times a day (TID) | ORAL | Status: DC | PRN

## 2013-10-17 NOTE — Progress Notes (Signed)
   Subjective:    Patient ID: Chelsey [name], female    DOB: Jun 30, 1970, 43 y.o.   MRN: [id number]  HPI Patient on c/o chronic pain- She has had Fibromyalgia for 7 years and was on lortab and flexeril- She lost her insurance and the free clinic will  Not do pain meds. She has also developed TMJ which is causing her problems at night- She has no insurance to go to a pain clinic.    Review of Systems  Constitutional: Negative.   Eyes: Negative.   Respiratory: Negative.   Cardiovascular: Negative.   Genitourinary: Negative.   Neurological: Negative.   Psychiatric/Behavioral: Negative.   All other systems reviewed and are negative.      Objective:   Physical Exam  Constitutional: She is oriented to person, place, and time. She appears well-developed and well-nourished.  Cardiovascular: Normal rate, regular rhythm and normal heart sounds.   Pulmonary/Chest: Effort normal and breath sounds normal. Right breast exhibits no inverted nipple, no mass, no nipple discharge, no skin change and no tenderness. Left breast exhibits no inverted nipple, no mass, no nipple discharge, no skin change and no tenderness.  Musculoskeletal: Normal range of motion.  Point tenderness up and down back Multiple joint pains  Neurological: She is alert and oriented to person, place, and time. She has normal reflexes.  Skin: Skin is warm.  Psychiatric: She has a normal mood and affect. Her behavior is normal. Judgment and thought content normal.    Temp(Src) 98.3 F (36.8 C) (Oral)  Ht 5' 3.5" (1.613 m)  Wt 174 lb 12.8 oz (79.289 kg)  BMI 30.48 kg/m2  LMP 03/21/2009       Assessment & Plan:  1. Chronic pain syndrome Stay warm - HYDROcodone-acetaminophen (NORCO) 7.5-325 MG per tablet; Take 1 tablet by mouth every 6 (six) hours as needed. Needs namebrand.  Generic causes itching.  Dispense: 60 tablet; Refill: 0 - traMADol (ULTRAM) 50 MG tablet; Take 1 tablet (50 mg total) by mouth every 6 (six) hours  as needed.  Dispense: 30 tablet; Refill: 0  2. Fibromyalgia Frequent exercises  3. TMJ (dislocation of temporomandibular joint), subsequent encounter Mouth guard - cyclobenzaprine (FLEXERIL) 10 MG tablet; Take 1 tablet (10 mg total) by mouth 3 (three) times daily as needed for muscle spasms.  Dispense: 30 tablet; Refill: 1  4. Nipple discharge get mammogram done Labs pending   Pain contract signed Mary-Margaret Hassell Done, FNP

## 2013-10-17 NOTE — Patient Instructions (Signed)
Fibromyalgia Fibromyalgia is a disorder that is often misunderstood. It is associated with muscular pains and tenderness that comes and goes. It is often associated with fatigue and sleep disturbances. Though it tends to be long-lasting, fibromyalgia is not life-threatening. CAUSES  The exact cause of fibromyalgia is unknown. People with certain gene types are predisposed to developing fibromyalgia and other conditions. Certain factors can play a role as triggers, such as:  Spine disorders.  Arthritis.  Severe injury (trauma) and other physical stressors.  Emotional stressors. SYMPTOMS   The main symptom is pain and stiffness in the muscles and joints, which can vary over time.  Sleep and fatigue problems. Other related symptoms may include:  Bowel and bladder problems.  Headaches.  Visual problems.  Problems with odors and noises.  Depression or mood changes.  Painful periods (dysmenorrhea).  Dryness of the skin or eyes. DIAGNOSIS  There are no specific tests for diagnosing fibromyalgia. Patients can be diagnosed accurately from the specific symptoms they have. The diagnosis is made by determining that nothing else is causing the problems. TREATMENT  There is no cure. Management includes medicines and an active, healthy lifestyle. The goal is to enhance physical fitness, decrease pain, and improve sleep. HOME CARE INSTRUCTIONS   Only take over-the-counter or prescription medicines as directed by your caregiver. Sleeping pills, tranquilizers, and pain medicines may make your problems worse.  Low-impact aerobic exercise is very important and advised for treatment. At first, it may seem to make pain worse. Gradually increasing your tolerance will overcome this feeling.  Learning relaxation techniques and how to control stress will help you. Biofeedback, visual imagery, hypnosis, muscle relaxation, yoga, and meditation are all options.  Anti-inflammatory medicines and  physical therapy may provide short-term help.  Acupuncture or massage treatments may help.  Take muscle relaxant medicines as suggested by your caregiver.  Avoid stressful situations.  Plan a healthy lifestyle. This includes your diet, sleep, rest, exercise, and friends.  Find and practice a hobby you enjoy.  Join a fibromyalgia support group for interaction, ideas, and sharing advice. This may be helpful. SEEK MEDICAL CARE IF:  You are not having good results or improvement from your treatment. FOR MORE INFORMATION  National Fibromyalgia Association: www.fmaware.org Arthritis Foundation: www.arthritis.org Document Released: 03/07/2005 Document Revised: 05/30/2011 Document Reviewed: 06/17/2009 ExitCare Patient Information 2015 [employer]. This information is not intended to replace advice given to you by your health care provider. Make sure you discuss any questions you have with your health care provider.  

## 2013-10-18 LAB — PROLACTIN: PROLACTIN: 15 ng/mL (ref 4.8–23.3)

## 2013-11-21 ENCOUNTER — Telehealth: Payer: Self-pay | Admitting: Family Medicine

## 2013-11-21 ENCOUNTER — Encounter: Payer: Self-pay | Admitting: *Deleted

## 2013-11-21 NOTE — Telephone Encounter (Signed)
[facility] called and stated patient brought in a strange looking RX said written by Dr. Laurance Flatten with no date or quantity the last one we filled for her tramadol was from mmm in July the pharmacy is to fax over the Rx she brought in they are aware not to fill

## 2013-11-21 NOTE — Progress Notes (Signed)
Chelsey [name] in office today to pick up her Ultram Rx, at Dr.  Tawanna Sat request i talked with [name] regarding trying to stretch out her pain medication and take something between doses that would enable her to take less of the ultram, she seemed very receptive to this idea.

## 2013-11-28 ENCOUNTER — Telehealth: Payer: Self-pay | Admitting: Nurse Practitioner

## 2013-11-28 DIAGNOSIS — M3500 Sicca syndrome, unspecified: Secondary | ICD-10-CM

## 2013-11-28 NOTE — Telephone Encounter (Signed)
For what he does not do chronic pain

## 2013-11-29 DIAGNOSIS — M3500 Sicca syndrome, unspecified: Secondary | ICD-10-CM | POA: Insufficient documentation

## 2013-11-29 NOTE — Telephone Encounter (Signed)
Referral made 

## 2013-11-29 NOTE — Telephone Encounter (Signed)
For sjogren's and borderline lupus. States that Dr Dossie Der in [place] diagnosed her last year. She is with [facility].  Dr Charlestine Night will do a sliding scale for payment since she doesn't have insurance.

## 2013-12-12 ENCOUNTER — Ambulatory Visit (INDEPENDENT_AMBULATORY_CARE_PROVIDER_SITE_OTHER): Payer: Medicaid Other | Admitting: Nurse Practitioner

## 2013-12-12 ENCOUNTER — Encounter: Payer: Self-pay | Admitting: Nurse Practitioner

## 2013-12-12 VITALS — BP 131/85 | HR 79 | Temp 96.8°F | Ht 63.0 in | Wt 180.0 lb

## 2013-12-12 DIAGNOSIS — Z09 Encounter for follow-up examination after completed treatment for conditions other than malignant neoplasm: Secondary | ICD-10-CM

## 2013-12-12 DIAGNOSIS — R0789 Other chest pain: Secondary | ICD-10-CM

## 2013-12-12 MED ORDER — DIAZEPAM 10 MG PO TABS: ORAL_TABLET | ORAL | Status: DC

## 2013-12-12 MED ORDER — OLOPATADINE HCL 0.1 % OP SOLN: 1.0000 [drp] | Freq: Two times a day (BID) | OPHTHALMIC | Status: DC

## 2013-12-12 NOTE — Patient Instructions (Signed)
Fibromyalgia Fibromyalgia is a disorder that is often misunderstood. It is associated with muscular pains and tenderness that comes and goes. It is often associated with fatigue and sleep disturbances. Though it tends to be long-lasting, fibromyalgia is not life-threatening. CAUSES  The exact cause of fibromyalgia is unknown. People with certain gene types are predisposed to developing fibromyalgia and other conditions. Certain factors can play a role as triggers, such as:  Spine disorders.  Arthritis.  Severe injury (trauma) and other physical stressors.  Emotional stressors. SYMPTOMS   The main symptom is pain and stiffness in the muscles and joints, which can vary over time.  Sleep and fatigue problems. Other related symptoms may include:  Bowel and bladder problems.  Headaches.  Visual problems.  Problems with odors and noises.  Depression or mood changes.  Painful periods (dysmenorrhea).  Dryness of the skin or eyes. DIAGNOSIS  There are no specific tests for diagnosing fibromyalgia. Patients can be diagnosed accurately from the specific symptoms they have. The diagnosis is made by determining that nothing else is causing the problems. TREATMENT  There is no cure. Management includes medicines and an active, healthy lifestyle. The goal is to enhance physical fitness, decrease pain, and improve sleep. HOME CARE INSTRUCTIONS   Only take over-the-counter or prescription medicines as directed by your caregiver. Sleeping pills, tranquilizers, and pain medicines may make your problems worse.  Low-impact aerobic exercise is very important and advised for treatment. At first, it may seem to make pain worse. Gradually increasing your tolerance will overcome this feeling.  Learning relaxation techniques and how to control stress will help you. Biofeedback, visual imagery, hypnosis, muscle relaxation, yoga, and meditation are all options.  Anti-inflammatory medicines and  physical therapy may provide short-term help.  Acupuncture or massage treatments may help.  Take muscle relaxant medicines as suggested by your caregiver.  Avoid stressful situations.  Plan a healthy lifestyle. This includes your diet, sleep, rest, exercise, and friends.  Find and practice a hobby you enjoy.  Join a fibromyalgia support group for interaction, ideas, and sharing advice. This may be helpful. SEEK MEDICAL CARE IF:  You are not having good results or improvement from your treatment. FOR MORE INFORMATION  National Fibromyalgia Association: www.fmaware.org Arthritis Foundation: www.arthritis.org Document Released: 03/07/2005 Document Revised: 05/30/2011 Document Reviewed: 06/17/2009 ExitCare Patient Information 2015 [employer]. This information is not intended to replace advice given to you by your health care provider. Make sure you discuss any questions you have with your health care provider.  

## 2013-12-12 NOTE — Addendum Note (Signed)
Addended by: Chevis Pretty on: 12/12/2013 02:31 PM   Modules accepted: Orders

## 2013-12-12 NOTE — Progress Notes (Signed)
   Subjective:    Patient ID: Chelsey [name], female    DOB: 1970-06-03, 43 y.o.   MRN: [id number]  HPI Patient in today for hospital follow up- She went to ER on September 21,2015 with chest pain. Stayed 24 hours and they did troponin levels which were negative EKG normal sinus rhythm- Stress test was negative- They sent her home on no new meds other then muscle relaxer which helps some.    Review of Systems  Constitutional: Negative.   HENT: Negative.   Respiratory: Positive for chest tightness (intermittent).   Cardiovascular: Negative.   Gastrointestinal: Negative.   Genitourinary: Negative.   Neurological: Negative.   Psychiatric/Behavioral: Negative.   All other systems reviewed and are negative.      Objective:   Physical Exam  Constitutional: She is oriented to person, place, and time. She appears well-developed and well-nourished.  Cardiovascular: Normal rate, regular rhythm and normal heart sounds.   Pulmonary/Chest: Effort normal and breath sounds normal.  Musculoskeletal:  No chest wall pain on palaption  Neurological: She is alert and oriented to person, place, and time.  Skin: Skin is warm and dry.  Psychiatric: She has a normal mood and affect. Her behavior is normal. Judgment and thought content normal.   BP 131/85  Pulse 79  Temp(Src) 96.8 F (36 C) (Oral)  Ht 5\' 3"  (1.6 m)  Wt 180 lb (81.647 kg)  BMI 31.89 kg/m2  LMP 03/21/2009        Assessment & Plan:   1. Hospital discharge follow-up   2. Atypical chest pain   hospital records reviewed Continue current meds Rest Moist heat to chest wall Follow up as needed  Mary-Margaret Hassell Done, FNP

## 2013-12-13 ENCOUNTER — Telehealth: Payer: Self-pay | Admitting: Nurse Practitioner

## 2013-12-13 DIAGNOSIS — G894 Chronic pain syndrome: Secondary | ICD-10-CM

## 2013-12-13 MED ORDER — HYDROCODONE-ACETAMINOPHEN 7.5-325 MG PO TABS: 1.0000 | ORAL_TABLET | Freq: Four times a day (QID) | ORAL | Status: DC | PRN

## 2013-12-13 NOTE — Telephone Encounter (Signed)
Call her pharmacy because patien said that they wuld let you fax it this one time

## 2013-12-13 NOTE — Telephone Encounter (Signed)
Script sent to [facility] and patient aware.

## 2013-12-19 ENCOUNTER — Other Ambulatory Visit: Payer: Self-pay | Admitting: Nurse Practitioner

## 2014-02-11 ENCOUNTER — Telehealth: Payer: Self-pay | Admitting: Nurse Practitioner

## 2014-02-11 DIAGNOSIS — M3500 Sicca syndrome, unspecified: Secondary | ICD-10-CM

## 2014-02-11 NOTE — Telephone Encounter (Signed)
Referral made for sjoren's syndrome

## 2014-02-11 NOTE — Telephone Encounter (Signed)
Patient needs a referral to Dr. Velvet Bathe for sjoegren syndrome. Dr. Annamarie Major office needs something stated that she is not being referred for her fibromyalgia that it is being treated by [place]. If you have any questions please feel free to contact patient. Patient states that she will come in to be seen if she needs to

## 2014-02-12 NOTE — Telephone Encounter (Signed)
spoked to pt, aware referral has been made. Pt states she doesn't need an appointment with MMM. Will close encounter.

## 2014-03-05 ENCOUNTER — Other Ambulatory Visit: Payer: Self-pay | Admitting: *Deleted

## 2014-03-05 MED ORDER — FLUCONAZOLE 150 MG PO TABS: ORAL_TABLET | ORAL | Status: DC

## 2014-03-05 NOTE — Telephone Encounter (Signed)
Pharmacy faxed over refill request for pt, ok to refill? If approved will go electronically to [facility].

## 2014-03-25 ENCOUNTER — Telehealth: Payer: Self-pay | Admitting: Nurse Practitioner

## 2014-03-25 ENCOUNTER — Other Ambulatory Visit: Payer: Self-pay | Admitting: Nurse Practitioner

## 2014-03-25 ENCOUNTER — Telehealth: Payer: Self-pay

## 2014-03-25 DIAGNOSIS — M3500 Sicca syndrome, unspecified: Secondary | ICD-10-CM

## 2014-03-25 DIAGNOSIS — B372 Candidiasis of skin and nail: Secondary | ICD-10-CM

## 2014-03-25 NOTE — Telephone Encounter (Signed)
Pt aware referral made

## 2014-03-25 NOTE — Telephone Encounter (Signed)
Please call me about getting a rheumatology referral

## 2014-03-25 NOTE — Telephone Encounter (Signed)
Stp she needs to see rheaumatologist appt for srogens syndrome, she wants referral for Dr. Avon Gully in [place], phone number [phone].

## 2014-03-25 NOTE — Telephone Encounter (Signed)
Referral made 

## 2014-03-25 NOTE — Telephone Encounter (Signed)
Need to know why patient needs referral

## 2014-03-26 ENCOUNTER — Other Ambulatory Visit: Payer: Self-pay | Admitting: Nurse Practitioner

## 2014-03-26 DIAGNOSIS — G894 Chronic pain syndrome: Secondary | ICD-10-CM

## 2014-03-26 MED ORDER — HYDROCODONE-ACETAMINOPHEN 7.5-325 MG PO TABS: 1.0000 | ORAL_TABLET | Freq: Four times a day (QID) | ORAL | Status: DC | PRN

## 2014-03-26 NOTE — Telephone Encounter (Signed)
Aware,referral for gyn done.

## 2014-03-26 NOTE — Telephone Encounter (Signed)
norco rx ready for pick up CAN NOT BE SENT TO PHARMACY- MUST PICK UP

## 2014-03-26 NOTE — Telephone Encounter (Signed)
Aware.Hydrocodone script ready.

## 2014-04-02 ENCOUNTER — Encounter: Payer: Self-pay | Admitting: Obstetrics & Gynecology

## 2014-04-08 ENCOUNTER — Encounter: Payer: Self-pay | Admitting: Family Medicine

## 2014-04-08 ENCOUNTER — Ambulatory Visit: Payer: Medicaid Other | Admitting: Family Medicine

## 2014-04-08 ENCOUNTER — Ambulatory Visit (INDEPENDENT_AMBULATORY_CARE_PROVIDER_SITE_OTHER): Payer: Medicaid Other | Admitting: Family Medicine

## 2014-04-08 VITALS — BP 138/97 | HR 116 | Temp 99.2°F | Ht 63.0 in | Wt 150.0 lb

## 2014-04-08 DIAGNOSIS — J206 Acute bronchitis due to rhinovirus: Secondary | ICD-10-CM

## 2014-04-08 MED ORDER — MONTELUKAST SODIUM 10 MG PO TABS: 10.0000 mg | ORAL_TABLET | Freq: Every day | ORAL | Status: DC

## 2014-04-08 MED ORDER — LEVALBUTEROL HCL 1.25 MG/0.5ML IN NEBU: 1.2500 mg | INHALATION_SOLUTION | Freq: Once | RESPIRATORY_TRACT | Status: AC

## 2014-04-08 MED ORDER — AZITHROMYCIN 250 MG PO TABS: ORAL_TABLET | ORAL | Status: DC

## 2014-04-08 MED ORDER — ALBUTEROL SULFATE HFA 108 (90 BASE) MCG/ACT IN AERS: 2.0000 | INHALATION_SPRAY | Freq: Four times a day (QID) | RESPIRATORY_TRACT | Status: DC | PRN

## 2014-04-08 MED ORDER — IPRATROPIUM-ALBUTEROL 0.5-2.5 (3) MG/3ML IN SOLN
3.0000 mL | Freq: Once | RESPIRATORY_TRACT | Status: AC
  Administered 2014-04-08: 3 mL via RESPIRATORY_TRACT

## 2014-04-08 MED ORDER — HYDROCODONE-HOMATROPINE 5-1.5 MG/5ML PO SYRP: 5.0000 mL | ORAL_SOLUTION | Freq: Three times a day (TID) | ORAL | Status: DC | PRN

## 2014-04-08 NOTE — Addendum Note (Signed)
Addended by: Lysbeth Penner on: 04/08/2014 04:58 PM   Modules accepted: Orders

## 2014-04-08 NOTE — Progress Notes (Signed)
   Subjective:    Patient ID: ARYONA SILL, female    DOB: [birth year], 44 y.o.   MRN: [id number]  HPI Patient is here for c/o uri and persistent cough.  Review of Systems  Constitutional: Negative for fever.  HENT: Negative for ear pain.   Eyes: Negative for discharge.  Respiratory: Positive for cough and wheezing.   Cardiovascular: Negative for chest pain.  Gastrointestinal: Negative for abdominal distention.  Endocrine: Negative for polyuria.  Genitourinary: Negative for difficulty urinating.  Musculoskeletal: Negative for gait problem and neck pain.  Skin: Negative for color change and rash.  Neurological: Negative for speech difficulty and headaches.  Psychiatric/Behavioral: Negative for agitation.       Objective:    BP 138/97 mmHg  Pulse 116  Temp(Src) 99.2 F (37.3 C) (Oral)  Ht 5\' 3"  (1.6 m)  Wt 150 lb (68.04 kg)  BMI 26.58 kg/m2  LMP 03/21/2009 Physical Exam  Constitutional: She is oriented to person, place, and time. She appears well-developed and well-nourished.  HENT:  Head: Normocephalic and atraumatic.  Mouth/Throat: Oropharynx is clear and moist.  Eyes: Pupils are equal, round, and reactive to light.  Neck: Normal range of motion. Neck supple.  Cardiovascular: Normal rate and regular rhythm.   No murmur heard. Pulmonary/Chest: Effort normal. She has wheezes.  Abdominal: Soft. Bowel sounds are normal. There is no tenderness.  Neurological: She is alert and oriented to person, place, and time.  Skin: Skin is warm and dry.  Psychiatric: She has a normal mood and affect.          Assessment & Plan:     ICD-9-CM ICD-10-CM   1. Acute bronchitis due to Rhinovirus 466.0 J20.6 levalbuterol (XOPENEX) nebulizer solution 1.25 mg   079.3  azithromycin (ZITHROMAX) 250 MG tablet     HYDROcodone-homatropine (HYCODAN) 5-1.5 MG/5ML syrup   Push po fluids, rest, tylenol and motrin otc prn as directed for fever, arthralgias, and myalgias.  Follow up prn if  sx's continue or persist.  Symbicort 160/4.5 2 puffs bid #1 sample  Return if symptoms worsen or fail to improve.  Lysbeth Penner FNP

## 2014-04-08 NOTE — Addendum Note (Signed)
Addended by: Ilean China on: 04/08/2014 05:08 PM   Modules accepted: Orders

## 2014-05-12 ENCOUNTER — Other Ambulatory Visit: Payer: Self-pay | Admitting: Nurse Practitioner

## 2014-05-13 NOTE — Telephone Encounter (Signed)
Last seen 04/08/14 B [place]

## 2014-05-23 ENCOUNTER — Telehealth: Payer: Self-pay | Admitting: Nurse Practitioner

## 2014-05-23 DIAGNOSIS — K119 Disease of salivary gland, unspecified: Secondary | ICD-10-CM

## 2014-05-25 NOTE — Telephone Encounter (Signed)
Referral made 

## 2014-05-26 NOTE — Telephone Encounter (Signed)
Patient aware.

## 2014-06-03 ENCOUNTER — Telehealth: Payer: Self-pay | Admitting: Nurse Practitioner

## 2014-06-03 MED ORDER — ONDANSETRON HCL 4 MG PO TABS: 4.0000 mg | ORAL_TABLET | Freq: Three times a day (TID) | ORAL | Status: DC | PRN

## 2014-06-03 NOTE — Telephone Encounter (Signed)
zofran rx sent to pharmacy

## 2014-06-19 ENCOUNTER — Ambulatory Visit (INDEPENDENT_AMBULATORY_CARE_PROVIDER_SITE_OTHER): Payer: Medicaid Other | Admitting: Otolaryngology

## 2014-06-19 DIAGNOSIS — K1121 Acute sialoadenitis: Secondary | ICD-10-CM | POA: Diagnosis not present

## 2014-06-23 ENCOUNTER — Ambulatory Visit: Payer: Medicaid Other | Admitting: Obstetrics & Gynecology

## 2014-06-23 ENCOUNTER — Other Ambulatory Visit (INDEPENDENT_AMBULATORY_CARE_PROVIDER_SITE_OTHER): Payer: Self-pay | Admitting: Otolaryngology

## 2014-06-23 DIAGNOSIS — R221 Localized swelling, mass and lump, neck: Secondary | ICD-10-CM

## 2014-06-26 ENCOUNTER — Ambulatory Visit (HOSPITAL_COMMUNITY)
Admission: RE | Admit: 2014-06-26 | Discharge: 2014-06-26 | Disposition: A | Discharge status: Home / Self Care | Payer: Medicaid Other | Source: Ambulatory Visit | Attending: Otolaryngology | Admitting: Otolaryngology

## 2014-06-26 DIAGNOSIS — E890 Postprocedural hypothyroidism: Secondary | ICD-10-CM | POA: Diagnosis not present

## 2014-06-26 DIAGNOSIS — R221 Localized swelling, mass and lump, neck: Secondary | ICD-10-CM | POA: Diagnosis not present

## 2014-06-26 MED ORDER — IOHEXOL 300 MG/ML  SOLN
75.0000 mL | Freq: Once | INTRAMUSCULAR | Status: AC | PRN
  Administered 2014-06-26: 75 mL via INTRAVENOUS

## 2014-07-03 ENCOUNTER — Other Ambulatory Visit: Payer: Self-pay | Admitting: Nurse Practitioner

## 2014-07-03 ENCOUNTER — Other Ambulatory Visit: Payer: Self-pay | Admitting: Family Medicine

## 2014-07-03 DIAGNOSIS — G894 Chronic pain syndrome: Secondary | ICD-10-CM

## 2014-07-03 MED ORDER — CLONAZEPAM 1 MG PO TABS: 1.0000 mg | ORAL_TABLET | Freq: Every day | ORAL | Status: DC

## 2014-07-03 MED ORDER — HYDROCODONE-ACETAMINOPHEN 7.5-325 MG PO TABS: 1.0000 | ORAL_TABLET | Freq: Four times a day (QID) | ORAL | Status: DC | PRN

## 2014-07-03 NOTE — Telephone Encounter (Signed)
PT CALLED DOES NEED ULTRAM, HYDROCODONE AND DIAZEPAM   ULTRAM NEEDS TO BE WRITTEN FOR NAME BRAND - NEEDS PRINTED SEPARATE FOR A DIFFERENT PHARM

## 2014-07-03 NOTE — Telephone Encounter (Signed)
rx ready for pickup 

## 2014-07-03 NOTE — Telephone Encounter (Signed)
Patient aware.

## 2014-07-04 NOTE — Telephone Encounter (Signed)
Last filled 11/28/13, last seen 12/12/13.

## 2014-07-04 NOTE — Telephone Encounter (Signed)
no more refills without being seen Ultram rx ready for pick up

## 2014-07-11 ENCOUNTER — Ambulatory Visit (INDEPENDENT_AMBULATORY_CARE_PROVIDER_SITE_OTHER): Payer: Medicaid Other | Admitting: Nurse Practitioner

## 2014-07-11 ENCOUNTER — Ambulatory Visit (INDEPENDENT_AMBULATORY_CARE_PROVIDER_SITE_OTHER): Payer: Medicaid Other

## 2014-07-11 ENCOUNTER — Encounter: Payer: Self-pay | Admitting: Nurse Practitioner

## 2014-07-11 VITALS — BP 134/90 | HR 73 | Temp 97.1°F | Ht 63.0 in | Wt 180.2 lb

## 2014-07-11 DIAGNOSIS — M545 Low back pain: Secondary | ICD-10-CM | POA: Diagnosis not present

## 2014-07-11 DIAGNOSIS — R1110 Vomiting, unspecified: Secondary | ICD-10-CM

## 2014-07-11 DIAGNOSIS — R768 Other specified abnormal immunological findings in serum: Secondary | ICD-10-CM

## 2014-07-11 MED ORDER — ONDANSETRON HCL 8 MG PO TABS: 8.0000 mg | ORAL_TABLET | Freq: Three times a day (TID) | ORAL | Status: DC | PRN

## 2014-07-11 NOTE — Patient Instructions (Signed)

## 2014-07-11 NOTE — Progress Notes (Signed)
   Subjective:    Patient ID: LESLI ISSA, female    DOB: April 21, 1970, 44 y.o.   MRN: [id number]  HPI Patient in today c/o nausea- she is on zofran 4mg - started over a year ago but now she cannot even eat because she is so nauseated. She was on pain meds for back painand that did not help with nausea- says that she cant hold anything down. Denies weight change. Has seen GI and they cannot find anything wrong- Had upper and lower endoscopy with negative results.  * Has seen rheuatologist and was positive ANA and they started her on prednisone and that helped nausea but made her CRAZY. Rheumatologist told her she did not know waht to do with her and to see someone else.  Review of Systems  Constitutional: Negative.   HENT: Negative.   Respiratory: Negative.   Cardiovascular: Negative.   Gastrointestinal: Positive for nausea, vomiting and constipation.  Genitourinary: Negative.   Musculoskeletal: Negative.   Neurological: Negative.   Psychiatric/Behavioral: Negative.   All other systems reviewed and are negative.      Objective:   Physical Exam  Constitutional: She is oriented to person, place, and time. She appears well-developed and well-nourished.  Cardiovascular: Normal rate, regular rhythm and normal heart sounds.   Pulmonary/Chest: Effort normal and breath sounds normal.  Abdominal: Soft. Bowel sounds are normal. She exhibits no distension and no mass. There is no tenderness. There is no rebound and no guarding.  Neurological: She is alert and oriented to person, place, and time.  Skin: Skin is warm.  Psychiatric: She has a normal mood and affect. Her behavior is normal. Judgment and thought content normal.   BP 134/90 mmHg  Pulse 73  Temp(Src) 97.1 F (36.2 C) (Oral)  Ht 5\' 3"  (1.6 m)  Wt 180 lb 3.2 oz (81.738 kg)  BMI 31.93 kg/m2  LMP 03/21/2009'   KUB- mid stool burden-Preliminary reading by Ronnald Collum, FNP  [facility]     Assessment & Plan:  1. Low back pain,  unspecified back pain laterality, with sciatica presence unspecified - DG Lumbar Spine 2-3 Views; Future  2. Intractable vomiting with nausea, vomiting of unspecified type Increased zofran to 8mg  Q6 as needed  3. Positive ANA (antinuclear antibody) Will try a new rheumatologist for second opinion - Ambulatory referral to Rheumatology   [place], [place]

## 2014-07-21 ENCOUNTER — Ambulatory Visit: Payer: Medicaid Other | Admitting: Obstetrics & Gynecology

## 2014-07-21 ENCOUNTER — Emergency Department (HOSPITAL_COMMUNITY)
Admission: EM | Admit: 2014-07-21 | Discharge: 2014-07-21 | Disposition: A | Discharge status: Home / Self Care | Payer: Medicaid Other | Attending: Emergency Medicine | Admitting: Emergency Medicine

## 2014-07-21 ENCOUNTER — Encounter (HOSPITAL_COMMUNITY): Payer: Self-pay | Admitting: Emergency Medicine

## 2014-07-21 DIAGNOSIS — Z8742 Personal history of other diseases of the female genital tract: Secondary | ICD-10-CM | POA: Diagnosis not present

## 2014-07-21 DIAGNOSIS — J45909 Unspecified asthma, uncomplicated: Secondary | ICD-10-CM | POA: Insufficient documentation

## 2014-07-21 DIAGNOSIS — Z8701 Personal history of pneumonia (recurrent): Secondary | ICD-10-CM | POA: Diagnosis not present

## 2014-07-21 DIAGNOSIS — F329 Major depressive disorder, single episode, unspecified: Secondary | ICD-10-CM | POA: Insufficient documentation

## 2014-07-21 DIAGNOSIS — Z79899 Other long term (current) drug therapy: Secondary | ICD-10-CM | POA: Diagnosis not present

## 2014-07-21 DIAGNOSIS — Z85850 Personal history of malignant neoplasm of thyroid: Secondary | ICD-10-CM | POA: Diagnosis not present

## 2014-07-21 DIAGNOSIS — E039 Hypothyroidism, unspecified: Secondary | ICD-10-CM | POA: Diagnosis not present

## 2014-07-21 DIAGNOSIS — M1990 Unspecified osteoarthritis, unspecified site: Secondary | ICD-10-CM | POA: Insufficient documentation

## 2014-07-21 DIAGNOSIS — R109 Unspecified abdominal pain: Secondary | ICD-10-CM | POA: Insufficient documentation

## 2014-07-21 DIAGNOSIS — F419 Anxiety disorder, unspecified: Secondary | ICD-10-CM | POA: Insufficient documentation

## 2014-07-21 DIAGNOSIS — Z8541 Personal history of malignant neoplasm of cervix uteri: Secondary | ICD-10-CM | POA: Insufficient documentation

## 2014-07-21 DIAGNOSIS — Z8719 Personal history of other diseases of the digestive system: Secondary | ICD-10-CM | POA: Insufficient documentation

## 2014-07-21 DIAGNOSIS — Z862 Personal history of diseases of the blood and blood-forming organs and certain disorders involving the immune mechanism: Secondary | ICD-10-CM | POA: Insufficient documentation

## 2014-07-21 DIAGNOSIS — M545 Low back pain: Secondary | ICD-10-CM | POA: Diagnosis not present

## 2014-07-21 LAB — URINALYSIS, ROUTINE W REFLEX MICROSCOPIC
Bilirubin Urine: NEGATIVE
GLUCOSE, UA: NEGATIVE mg/dL
HGB URINE DIPSTICK: NEGATIVE
KETONES UR: NEGATIVE mg/dL
LEUKOCYTES UA: NEGATIVE
Nitrite: NEGATIVE
PH: 5.5 (ref 5.0–8.0)
Protein, ur: NEGATIVE mg/dL
SPECIFIC GRAVITY, URINE: 1.01 (ref 1.005–1.030)
Urobilinogen, UA: 0.2 mg/dL (ref 0.0–1.0)

## 2014-07-21 MED ORDER — HYDROMORPHONE HCL 1 MG/ML IJ SOLN
1.0000 mg | Freq: Once | INTRAMUSCULAR | Status: AC
  Administered 2014-07-21: 1 mg via INTRAMUSCULAR
  Filled 2014-07-21: qty 1

## 2014-07-21 MED ORDER — TRAMADOL HCL 50 MG PO TABS: 50.0000 mg | ORAL_TABLET | Freq: Four times a day (QID) | ORAL | Status: DC | PRN

## 2014-07-21 NOTE — ED Notes (Signed)
Pt. Does not need tramadol prescription, she reports having tramadol at home. Script will be shredded.

## 2014-07-21 NOTE — ED Notes (Signed)
Patient states that she has been having back pain for 3 weeks but it has gotten progressively unbearable the past 2 weeks.  She saw her PCP on 08/10/14 and they did an xray that came back normal.  Patient states the pain is sharp, cramping, and pinching.

## 2014-07-21 NOTE — ED Notes (Signed)
Pt report intermittent mid back pain and nausea for last several weeks but reports became more intense x2 days ago.

## 2014-07-21 NOTE — Discharge Instructions (Signed)
Urinalysis was normal. Rest.  Alternate heat and ice. Prescription for pain medicine. Follow-up your doctor.

## 2014-07-21 NOTE — ED Provider Notes (Signed)
CSN: [id number]     Arrival date & time 07/21/14  1106 History  This chart was scribed for Nat Christen, MD by Eustaquio Maize, ED Scribe. This patient was seen in room APA11/APA11 and the patient's care was started at 11:51 AM.     Chief Complaint  Patient presents with  . Back Pain   The history is provided by the patient. No language interpreter was used.     HPI Comments: Chelsey [name] is a 44 y.o. female with hx Fibromyalgia who presents to the Emergency Department complaining of bilateral flank and bilateral lower back pain that began 3 weeks ago. She states that the pain is greater in the right than the left. She also complains of dark colored urine, nausea, and photophobia. Pt took Zofran approximately 2 hours ago with mild relief. Denies recent injury or trauma to the back. No bowel or bladder incontinence.   Past Medical History  Diagnosis Date  . Hypothyroidism   . Anemia   . Allergy   . Fibromyalgia   . Stomach pain   . IBS (irritable bowel syndrome)   . Anxiety   . Asthma   . Arthritis   . Sjogren's disease   . Cervical cancer   . Thyroid cancer     ?  Marland Kitchen Hashimoto's disease   . Depression   . Pneumonia   . Endometriosis   . Lupus    Past Surgical History  Procedure Laterality Date  . Cyst removed to uterus    . Miscarriage  2  . Ablation    . Thyroidectomy    . Abdominal hysterectomy    . Ovarian cyst surgery      x 2  . Throat surgery      or cyst and tumors   Family History  Problem Relation Age of Onset  . Fibromyalgia Mother   . Thyroid nodules Mother   . Alcohol abuse Father   . Hypertension Sister   . Diabetes Brother    History  Substance Use Topics  . Smoking status: Never Smoker   . Smokeless tobacco: Never Used  . Alcohol Use: Yes     Comment: occ   OB History    No data available     Review of Systems  A complete 10 system review of systems was obtained and all systems are negative except as noted in the HPI and PMH.     Allergies  Augmentin; Celebrex; Cymbalta; Duloxetine; Flexeril; Prednisone; and Savella  Home Medications   Prior to Admission medications   Medication Sig Start Date End Date Taking? Authorizing Provider  albuterol (PROVENTIL HFA;VENTOLIN HFA) 108 (90 BASE) MCG/ACT inhaler Inhale 2 puffs into the lungs every 6 (six) hours as needed for wheezing or shortness of breath. 04/08/14  Yes Lysbeth Penner, FNP  carisoprodol (SOMA) 350 MG tablet Take 350 mg by mouth 4 (four) times daily as needed for muscle spasms.   Yes Historical Provider, MD  clonazePAM (KLONOPIN) 1 MG tablet Take 1 tablet (1 mg total) by mouth daily. Patient taking differently: Take 1 mg by mouth daily as needed for anxiety.  07/03/14  Yes Mary-Margaret Hassell Done, FNP  cycloSPORINE (RESTASIS) 0.05 % ophthalmic emulsion Place 1 drop into both eyes 2 (two) times daily.    Yes Historical Provider, MD  diazepam (VALIUM) 10 MG tablet 1 po BID muscle spasms Patient taking differently: Take 10 mg by mouth 2 (two) times daily as needed for anxiety (muscle spasms). 1 po BID  muscle spasms 12/12/13  Yes Mary-Margaret Hassell Done, FNP  HYDROcodone-acetaminophen (NORCO) 7.5-325 MG per tablet Take 1 tablet by mouth every 6 (six) hours as needed. Needs namebrand.  Generic causes itching. Patient taking differently: Take 1 tablet by mouth every 6 (six) hours as needed for moderate pain. Needs namebrand.  Generic causes itching. 07/03/14  Yes Mary-Margaret Hassell Done, FNP  meloxicam (MOBIC) 15 MG tablet Take 15 mg by mouth. 06/17/14 06/17/15 Yes Historical Provider, MD  olopatadine (PATANOL) 0.1 % ophthalmic solution Place 1 drop into both eyes 2 (two) times daily. 12/12/13  Yes Mary-Margaret Hassell Done, FNP  ondansetron (ZOFRAN) 8 MG tablet Take 1 tablet (8 mg total) by mouth every 8 (eight) hours as needed for nausea or vomiting. 07/11/14  Yes Mary-Margaret Hassell Done, FNP  thyroid (ARMOUR) 60 MG tablet Take 60 mg by mouth.   Yes Historical Provider, MD  traMADol  (ULTRAM) 50 MG tablet Take 1 tablet (50 mg total) by mouth every 6 (six) hours as needed. 07/21/14   Nat Christen, MD   Triage Vitals: BP 151/91 mmHg  Pulse 78  Temp(Src) 98.7 F (37.1 C) (Oral)  Resp 18  Ht 5\' 4"  (1.626 m)  Wt 175 lb (79.379 kg)  BMI 30.02 kg/m2  SpO2 99%  LMP 03/21/2009   Physical Exam  Constitutional: She is oriented to person, place, and time. She appears well-developed and well-nourished.  Bilateral flank and lower back tenderness.   HENT:  Head: Normocephalic and atraumatic.  Eyes: Conjunctivae and EOM are normal. Pupils are equal, round, and reactive to light.  Neck: Normal range of motion. Neck supple.  Cardiovascular: Normal rate and regular rhythm.   Pulmonary/Chest: Effort normal and breath sounds normal.  Abdominal: Soft. Bowel sounds are normal.  Musculoskeletal: Normal range of motion.  Neurological: She is alert and oriented to person, place, and time.  Skin: Skin is warm and dry.  Psychiatric: She has a normal mood and affect. Her behavior is normal.  Nursing note and vitals reviewed.   ED Course  Procedures (including critical care time)  DIAGNOSTIC STUDIES: Oxygen Saturation is 99% on RA, normal by my interpretation.    COORDINATION OF CARE: 11:57 AM-Discussed treatment plan which includes pain medication and lumbar x ray with pt at bedside and pt agreed to plan.   Labs Review Labs Reviewed  URINALYSIS, ROUTINE W REFLEX MICROSCOPIC    Imaging Review No results found.   EKG Interpretation None      MDM   Final diagnoses:  Low back pain without sciatica, unspecified back pain laterality   Patient is in no acute distress. Urinalysis negative. No radicular symptoms. No acute abdomen. Discharge medications Ultram 50 mg   I personally performed the services described in this documentation, which was scribed in my presence. The recorded information has been reviewed and is accurate.      Nat Christen, MD 07/21/14 [phone]

## 2014-07-23 ENCOUNTER — Telehealth: Payer: Self-pay | Admitting: Nurse Practitioner

## 2014-07-23 NOTE — Telephone Encounter (Signed)
Klonopin is on here chart- have not filled valium in awhile

## 2014-07-24 NOTE — Telephone Encounter (Signed)
Pt states valium has been filled regularly and that the klonopin has not been filled in awhile, that this must be written down backwards. Has appt for tomorrow and patient will discuss this with provider at visit

## 2014-07-25 ENCOUNTER — Ambulatory Visit (INDEPENDENT_AMBULATORY_CARE_PROVIDER_SITE_OTHER): Payer: Medicaid Other | Admitting: Nurse Practitioner

## 2014-07-25 ENCOUNTER — Encounter: Payer: Self-pay | Admitting: Nurse Practitioner

## 2014-07-25 VITALS — BP 124/75 | HR 70 | Temp 97.3°F | Ht 64.0 in | Wt 178.0 lb

## 2014-07-25 DIAGNOSIS — R110 Nausea: Secondary | ICD-10-CM

## 2014-07-25 DIAGNOSIS — M546 Pain in thoracic spine: Secondary | ICD-10-CM | POA: Diagnosis not present

## 2014-07-25 DIAGNOSIS — Z09 Encounter for follow-up examination after completed treatment for conditions other than malignant neoplasm: Secondary | ICD-10-CM | POA: Diagnosis not present

## 2014-07-25 MED ORDER — DIAZEPAM 10 MG PO TABS: ORAL_TABLET | ORAL | Status: DC

## 2014-07-25 NOTE — Patient Instructions (Signed)

## 2014-07-25 NOTE — Progress Notes (Signed)
   Subjective:    Patient ID: Chelsey [name], female    DOB: [birth year], 44 y.o.   MRN: [id number]  HPI Patient in today c/o nausea and low back pain- has been going on for over a month- Rates pain 7/10 which stopped yesterday- no pain today. Now she just has a soreness. She has Nausea and is on zofran 8mg  which is helping- some better and has been able to eat the last 2 days. Went to er  Last week and was given dilaudid and snet her home- urine was clear.    Review of Systems  Constitutional: Negative.   HENT: Negative.   Respiratory: Negative.   Cardiovascular: Negative.   Gastrointestinal: Positive for nausea.  Musculoskeletal: Positive for back pain.  Neurological: Negative.   Psychiatric/Behavioral: Negative.   All other systems reviewed and are negative.      Objective:   Physical Exam  Constitutional: She is oriented to person, place, and time. She appears well-developed and well-nourished.  Cardiovascular: Normal rate, regular rhythm and normal heart sounds.   Pulmonary/Chest: Effort normal and breath sounds normal.  Musculoskeletal:  Mild CVA tenderness bil- FROM  Lumbar spine without pain  Neurological: She is alert and oriented to person, place, and time. She has normal reflexes.  Skin: Skin is warm.  Psychiatric: She has a normal mood and affect. Her behavior is normal. Judgment and thought content normal.   BP 124/75 mmHg  Pulse 70  Temp(Src) 97.3 F (36.3 C) (Oral)  Ht 5\' 4"  (1.626 m)  Wt 178 lb (80.74 kg)  BMI 30.54 kg/m2  LMP 03/21/2009        Assessment & Plan:   1. Hospital discharge follow-up   2. Bilateral thoracic back pain   3. Nausea    Meds ordered this encounter  Medications  . diazepam (VALIUM) 10 MG tablet    Sig: 1 po BID muscle spasms    Dispense:  60 tablet    Refill:  1    Order Specific Question:  Supervising Provider    Answer:  Chipper Herb [[id number]]   Moist heat to back-  Rest No heavy lifting Avoid spicy and fatty  foods RTO prn  Mary-Margaret Hassell Done, FNP

## 2014-09-01 ENCOUNTER — Encounter: Payer: Self-pay | Admitting: Family Medicine

## 2014-09-01 ENCOUNTER — Ambulatory Visit (INDEPENDENT_AMBULATORY_CARE_PROVIDER_SITE_OTHER): Payer: Medicaid Other | Admitting: Family Medicine

## 2014-09-01 VITALS — BP 104/81 | HR 83 | Temp 98.2°F | Ht 64.0 in | Wt 180.0 lb

## 2014-09-01 DIAGNOSIS — J40 Bronchitis, not specified as acute or chronic: Secondary | ICD-10-CM | POA: Diagnosis not present

## 2014-09-01 DIAGNOSIS — J329 Chronic sinusitis, unspecified: Secondary | ICD-10-CM

## 2014-09-01 MED ORDER — BETAMETHASONE SOD PHOS & ACET 6 (3-3) MG/ML IJ SUSP
6.0000 mg | Freq: Once | INTRAMUSCULAR | Status: AC
  Administered 2014-09-01: 6 mg via INTRAMUSCULAR

## 2014-09-01 MED ORDER — LEVOFLOXACIN 500 MG PO TABS: 500.0000 mg | ORAL_TABLET | Freq: Every day | ORAL | Status: DC

## 2014-09-01 NOTE — Addendum Note (Signed)
Addended by: Marin Olp on: 09/01/2014 02:48 PM   Modules accepted: Miquel Dunn

## 2014-09-01 NOTE — Progress Notes (Signed)
Subjective:  Patient ID: Chelsey [name], female    DOB: 1971/02/28  Age: 44 y.o. MRN: [id number]  CC: URI   HPI Chelsey [name] presents for 10 days of increasing cough and congestion. She has a history of fibromyalgia and sometimes doesn't even know when she is sick and when she isn't. She's had multiple workups for fibromyalgia she has a local rheumatologist in [place] and has seen Dr. Lurena Joiner at [facility]. She is further trying to get a referral to [facility]. Currently nothing she's tried has been useful and she has a great deal of discomfort. That is under the care of her rheumatologist. For now she has also earache and subjective fever as well as cough productive of purulent sputum.  History Marine has a past medical history of Hypothyroidism; Anemia; Allergy; Fibromyalgia; Stomach pain; IBS (irritable bowel syndrome); Anxiety; Asthma; Arthritis; Sjogren's disease; Cervical cancer; Thyroid cancer; Hashimoto's disease; Depression; Pneumonia; Endometriosis; and Lupus.   She has past surgical history that includes cyst removed to uterus; miscarriage (2); Ablation; Thyroidectomy; Abdominal hysterectomy; Ovarian cyst surgery; and Throat surgery.   Her family history includes Alcohol abuse in her father; Diabetes in her brother; Fibromyalgia in her mother; Hypertension in her sister; Thyroid nodules in her mother.She reports that she has never smoked. She has never used smokeless tobacco. She reports that she drinks alcohol. She reports that she does not use illicit drugs.  Outpatient Prescriptions Prior to Visit  Medication Sig Dispense Refill  . albuterol (PROVENTIL HFA;VENTOLIN HFA) 108 (90 BASE) MCG/ACT inhaler Inhale 2 puffs into the lungs every 6 (six) hours as needed for wheezing or shortness of breath. 1 Inhaler 11  . carisoprodol (SOMA) 350 MG tablet Take 350 mg by mouth 4 (four) times daily as needed for muscle spasms.    . cycloSPORINE (RESTASIS) 0.05 % ophthalmic emulsion Place 1 drop  into both eyes 2 (two) times daily.     . diazepam (VALIUM) 10 MG tablet 1 po BID muscle spasms 60 tablet 1  . HYDROcodone-acetaminophen (NORCO) 7.5-325 MG per tablet Take 1 tablet by mouth every 6 (six) hours as needed. Needs namebrand.  Generic causes itching. (Patient taking differently: Take 1 tablet by mouth every 6 (six) hours as needed for moderate pain. Needs namebrand.  Generic causes itching.) 60 tablet 0  . meloxicam (MOBIC) 15 MG tablet Take 15 mg by mouth.    Marland Kitchen olopatadine (PATANOL) 0.1 % ophthalmic solution Place 1 drop into both eyes 2 (two) times daily. 5 mL 3  . thyroid (ARMOUR) 60 MG tablet Take 60 mg by mouth.    . traMADol (ULTRAM) 50 MG tablet Take 1 tablet (50 mg total) by mouth every 6 (six) hours as needed. 15 tablet 0  . ondansetron (ZOFRAN) 8 MG tablet Take 1 tablet (8 mg total) by mouth every 8 (eight) hours as needed for nausea or vomiting. (Patient not taking: Reported on 09/01/2014) 30 tablet 0   Facility-Administered Medications Prior to Visit  Medication Dose Route Frequency Provider Last Rate Last Dose  . levalbuterol (XOPENEX) nebulizer solution 1.25 mg  1.25 mg Nebulization Once Lysbeth Penner, FNP   1.25 mg at 04/08/14 1700    ROS Review of Systems  Constitutional: Negative for fever, chills, activity change and appetite change.  HENT: Positive for congestion, postnasal drip, rhinorrhea and sinus pressure. Negative for ear discharge, ear pain, hearing loss, nosebleeds, sneezing and trouble swallowing.   Respiratory: Positive for cough and shortness of breath. Negative for chest tightness.  Cardiovascular: Negative for chest pain and palpitations.  Gastrointestinal: Positive for abdominal distention.  Musculoskeletal: Positive for myalgias, joint swelling, arthralgias and neck pain.  Skin: Negative for rash.    Objective:  BP 104/81 mmHg  Pulse 83  Temp(Src) 98.2 F (36.8 C) (Oral)  Ht 5\' 4"  (1.626 m)  Wt 180 lb (81.647 kg)  BMI 30.88 kg/m2  LMP  03/21/2009  BP Readings from Last 3 Encounters:  09/01/14 104/81  07/25/14 124/75  07/21/14 120/76    Wt Readings from Last 3 Encounters:  09/01/14 180 lb (81.647 kg)  07/25/14 178 lb (80.74 kg)  07/21/14 175 lb (79.379 kg)     Physical Exam  Constitutional: She is oriented to person, place, and time. She appears well-developed and well-nourished. No distress.  HENT:  Head: Normocephalic and atraumatic.  Right Ear: Tympanic membrane and external ear normal. No decreased hearing is noted.  Left Ear: Tympanic membrane and external ear normal. No decreased hearing is noted.  Nose: Nose normal. Right sinus exhibits no frontal sinus tenderness. Left sinus exhibits no frontal sinus tenderness.  Mouth/Throat: Oropharynx is clear and moist. No oropharyngeal exudate or posterior oropharyngeal erythema.  Eyes: Conjunctivae and EOM are normal. Pupils are equal, round, and reactive to light.  Neck: Normal range of motion. Neck supple. No Brudzinski's sign noted. No thyromegaly present.  Cardiovascular: Normal rate, regular rhythm and normal heart sounds.   No murmur heard. Pulmonary/Chest: Effort normal and breath sounds normal. No respiratory distress. She has no wheezes. She has no rales.  Abdominal: Soft. Bowel sounds are normal. She exhibits no distension. There is no tenderness.  Lymphadenopathy:       Head (right side): No preauricular adenopathy present.       Head (left side): No preauricular adenopathy present.    She has no cervical adenopathy.       Right cervical: No superficial cervical adenopathy present.      Left cervical: No superficial cervical adenopathy present.  Neurological: She is alert and oriented to person, place, and time. She has normal reflexes.  Skin: Skin is warm and dry.  Psychiatric: She has a normal mood and affect. Her behavior is normal. Judgment and thought content normal.    No results found for: HGBA1C  Lab Results  Component Value Date   WBC 6.4  08/31/2012   HGB 13.7 08/31/2012   HCT 38.9 08/31/2012   PLT 243 08/31/2012   GLUCOSE 102* 08/31/2012   CHOL 123 06/18/2012   TRIG 85 06/18/2012   HDL 41 06/18/2012   LDLCALC 65 06/18/2012   ALT 13 08/31/2012   AST 12 08/31/2012   NA 142 08/31/2012   K 4.5 08/31/2012   CL 107 08/31/2012   CREATININE 0.50 08/31/2012   BUN 20 08/31/2012   CO2 25 08/31/2012    No results found.  Assessment & Plan:   Vernis was seen today for uri.  Diagnoses and all orders for this visit:  Sinobronchitis Orders: -     betamethasone acetate-betamethasone sodium phosphate (CELESTONE) injection 6 mg; Inject 1 mL (6 mg total) into the muscle once.  Other orders -     levofloxacin (LEVAQUIN) 500 MG tablet; Take 1 tablet (500 mg total) by mouth daily.  I am having Ms. Osterman start on levofloxacin. I am also having her maintain her cycloSPORINE, thyroid, olopatadine, carisoprodol, albuterol, HYDROcodone-acetaminophen, meloxicam, ondansetron, traMADol, and diazepam. We will continue to administer levalbuterol and betamethasone acetate-betamethasone sodium phosphate.  Meds ordered this encounter  Medications  .  levofloxacin (LEVAQUIN) 500 MG tablet    Sig: Take 1 tablet (500 mg total) by mouth daily.    Dispense:  10 tablet    Refill:  0  . betamethasone acetate-betamethasone sodium phosphate (CELESTONE) injection 6 mg    Sig:      Follow-up: No Follow-up on file.  Claretta Fraise, M.D.

## 2014-09-02 ENCOUNTER — Telehealth: Payer: Self-pay | Admitting: Family Medicine

## 2014-09-02 MED ORDER — FLUCONAZOLE 150 MG PO TABS: 150.0000 mg | ORAL_TABLET | Freq: Once | ORAL | Status: DC

## 2014-09-02 NOTE — Telephone Encounter (Signed)
rx sent to pharmacy

## 2014-09-02 NOTE — Telephone Encounter (Signed)
Please send in as requested.

## 2014-09-20 ENCOUNTER — Telehealth: Payer: Self-pay | Admitting: Family Medicine

## 2014-09-20 MED ORDER — FLUCONAZOLE 150 MG PO TABS: 150.0000 mg | ORAL_TABLET | Freq: Once | ORAL | Status: DC

## 2014-09-20 NOTE — Telephone Encounter (Signed)
Diflucan rx sent to pharmacy.

## 2014-09-24 ENCOUNTER — Telehealth: Payer: Self-pay

## 2014-09-24 NOTE — Telephone Encounter (Signed)
Please refer as requested. Thank you, WS.

## 2014-09-24 NOTE — Telephone Encounter (Signed)
Wants a referral to Dr Temple Pacini at [place] [facility] [address]  Has been seeing him for 3 years but needs referral for [facility]

## 2014-09-25 ENCOUNTER — Other Ambulatory Visit: Payer: Self-pay

## 2014-09-25 DIAGNOSIS — M545 Low back pain: Secondary | ICD-10-CM

## 2014-10-01 ENCOUNTER — Other Ambulatory Visit: Payer: Self-pay | Admitting: Nurse Practitioner

## 2014-10-01 NOTE — Telephone Encounter (Signed)
Refill called to [facility]

## 2014-10-06 ENCOUNTER — Telehealth: Payer: Self-pay | Admitting: Nurse Practitioner

## 2014-10-06 NOTE — Telephone Encounter (Signed)
Patient aware that rx was sent in on the 13th.

## 2014-11-03 ENCOUNTER — Other Ambulatory Visit: Payer: Self-pay | Admitting: Family Medicine

## 2014-11-03 ENCOUNTER — Telehealth: Payer: Self-pay | Admitting: Family Medicine

## 2014-11-03 DIAGNOSIS — R768 Other specified abnormal immunological findings in serum: Secondary | ICD-10-CM

## 2014-11-04 NOTE — Telephone Encounter (Signed)
I do not recall referring her. We had some discussion about fibromyalgia and that she has two specialists for that. My main purpose at that time was simply to treat her bronchitis that she came in for. If she would like to see a rheumatologist I would be happy to recommend Dr. Franki Monte in [place]

## 2014-11-04 NOTE — Telephone Encounter (Signed)
Last seen 09/01/14  Dr Livia Snellen

## 2014-11-04 NOTE — Telephone Encounter (Signed)
Please advise  I have no referrals that I'm working on

## 2014-11-10 ENCOUNTER — Encounter: Payer: Self-pay | Admitting: Family Medicine

## 2014-11-10 ENCOUNTER — Ambulatory Visit (INDEPENDENT_AMBULATORY_CARE_PROVIDER_SITE_OTHER): Payer: Medicaid Other | Admitting: Family Medicine

## 2014-11-10 VITALS — BP 127/81 | HR 66 | Temp 97.8°F | Ht 64.0 in | Wt 181.4 lb

## 2014-11-10 DIAGNOSIS — M3500 Sicca syndrome, unspecified: Secondary | ICD-10-CM

## 2014-11-10 DIAGNOSIS — E559 Vitamin D deficiency, unspecified: Secondary | ICD-10-CM

## 2014-11-10 DIAGNOSIS — E039 Hypothyroidism, unspecified: Secondary | ICD-10-CM | POA: Diagnosis not present

## 2014-11-10 DIAGNOSIS — G894 Chronic pain syndrome: Secondary | ICD-10-CM

## 2014-11-10 DIAGNOSIS — M797 Fibromyalgia: Secondary | ICD-10-CM

## 2014-11-10 MED ORDER — CEVIMELINE HCL 30 MG PO CAPS: 30.0000 mg | ORAL_CAPSULE | Freq: Every day | ORAL | Status: DC

## 2014-11-10 MED ORDER — HYDROXYCHLOROQUINE SULFATE 200 MG PO TABS: 200.0000 mg | ORAL_TABLET | Freq: Every day | ORAL | Status: DC

## 2014-11-10 MED ORDER — OLOPATADINE HCL 0.1 % OP SOLN: 1.0000 [drp] | Freq: Two times a day (BID) | OPHTHALMIC | Status: AC

## 2014-11-10 MED ORDER — CLOTRIMAZOLE-BETAMETHASONE 1-0.05 % EX CREA: 1.0000 "application " | TOPICAL_CREAM | Freq: Two times a day (BID) | CUTANEOUS | Status: AC

## 2014-11-10 MED ORDER — NYSTATIN-TRIAMCINOLONE 100000-0.1 UNIT/GM-% EX OINT: 1.0000 "application " | TOPICAL_OINTMENT | Freq: Two times a day (BID) | CUTANEOUS | Status: DC

## 2014-11-10 NOTE — Progress Notes (Signed)
Subjective:  Patient ID: Chelsey [name], female    DOB: 28-Jan-1971  Age: 44 y.o. MRN: [id number]  CC: Fibromyalgia   HPI Chelsey [name] presents for Patient presents for follow-up on  thyroid. She has a history of hypothyroidism for many years. It has been stable recently. Pt. denies any change in  voice, loss of hair, heat or cold intolerance. Energy level has been adequate to good. She denies constipation and diarrhea. No myxedema. Medication is as noted below. Verified that pt is taking it daily on an empty stomach. Well tolerated.  Patient is in today also for referral for her for fibromyalgia/Sjogren's/lupus syndrome. She's had multiple workups by Dr. Lurena Joiner and Dr. Dossie Der. The most recent one blood work was reviewed in care everywhere as of 04/21/2014. It showed a 1/320 ANA. However the remainder of the arthritis panel was negative including the Sjogren's tests and the double-stranded DNA. As well as the complement and others. The patient states she was diagnosed with Sjogren's based on a dry eye dry mouth and dry vagina syndrome and some intestinal discomfort including intermittent diarrhea and constipation. She says that Dr. Dossie Der became frustrated because she couldn't pin a diagnosis on it that was satisfactory and asked her to see someone at [facility] for this. She was even referred to her thyroid doctor to see if there was possibly some thyroid tissue generating an antibody. This evaluation was negative. Unfortunately the patient still tends to have to take hydrocodone due to multiple allergies to prednisone Lyrica Savella and others as noted in the reaction section. Joint pain is 6/10. At fingertips. Patient does have history of Raynaud's phenomenon her at rheumatologist at one time suggested treatment with Plaquenil and/or methotrexate.    History Karaline has a past medical history of Hypothyroidism; Anemia; Allergy; Fibromyalgia; Stomach pain; IBS (irritable bowel syndrome); Anxiety; Asthma;  Arthritis; Sjogren's disease; Cervical cancer; Thyroid cancer; Hashimoto's disease; Depression; Pneumonia; Endometriosis; and Lupus.   She has past surgical history that includes cyst removed to uterus; miscarriage (2); Ablation; Thyroidectomy; Abdominal hysterectomy; Ovarian cyst surgery; and Throat surgery.   Her family history includes Alcohol abuse in her father; Diabetes in her brother; Fibromyalgia in her mother; Hypertension in her sister; Thyroid nodules in her mother.She reports that she has never smoked. She has never used smokeless tobacco. She reports that she drinks alcohol. She reports that she does not use illicit drugs.  Outpatient Prescriptions Prior to Visit  Medication Sig Dispense Refill  . albuterol (PROVENTIL HFA;VENTOLIN HFA) 108 (90 BASE) MCG/ACT inhaler Inhale 2 puffs into the lungs every 6 (six) hours as needed for wheezing or shortness of breath. 1 Inhaler 11  . carisoprodol (SOMA) 350 MG tablet TAKE ONE TABLET BY MOUTH AT BEDTIME AS NEEDED. 30 tablet 0  . cycloSPORINE (RESTASIS) 0.05 % ophthalmic emulsion Place 1 drop into both eyes 2 (two) times daily.     . diazepam (VALIUM) 10 MG tablet 1 po BID muscle spasms 60 tablet 1  . fluconazole (DIFLUCAN) 150 MG tablet Take 1 tablet (150 mg total) by mouth once. Daily for 6 days 6 tablet 0  . HYDROcodone-acetaminophen (NORCO) 7.5-325 MG per tablet Take 1 tablet by mouth every 6 (six) hours as needed. Needs namebrand.  Generic causes itching. (Patient taking differently: Take 1 tablet by mouth every 6 (six) hours as needed for moderate pain. Needs namebrand.  Generic causes itching.) 60 tablet 0  . meloxicam (MOBIC) 15 MG tablet Take 15 mg by mouth.    Marland Kitchen  ondansetron (ZOFRAN) 8 MG tablet Take 1 tablet (8 mg total) by mouth every 8 (eight) hours as needed for nausea or vomiting. 30 tablet 0  . thyroid (ARMOUR) 60 MG tablet Take 60 mg by mouth.    . traMADol (ULTRAM) 50 MG tablet Take 1 tablet (50 mg total) by mouth every 6 (six)  hours as needed. 15 tablet 0  . olopatadine (PATANOL) 0.1 % ophthalmic solution Place 1 drop into both eyes 2 (two) times daily. 5 mL 3  . levofloxacin (LEVAQUIN) 500 MG tablet Take 1 tablet (500 mg total) by mouth daily. (Patient not taking: Reported on 11/10/2014) 10 tablet 0   Facility-Administered Medications Prior to Visit  Medication Dose Route Frequency Provider Last Rate Last Dose  . levalbuterol (XOPENEX) nebulizer solution 1.25 mg  1.25 mg Nebulization Once Lysbeth Penner, FNP   1.25 mg at 04/08/14 1700    ROS Review of Systems  Constitutional: Positive for fatigue. Negative for fever, chills, diaphoresis, appetite change and unexpected weight change.  HENT: Negative for congestion, ear pain, hearing loss, postnasal drip, rhinorrhea and trouble swallowing.   Eyes: Negative for pain.  Respiratory: Negative for cough, chest tightness and shortness of breath.   Cardiovascular: Negative for chest pain and palpitations.  Gastrointestinal: Negative for nausea, vomiting, abdominal pain, diarrhea and constipation.  Genitourinary: Negative for dysuria, frequency and menstrual problem.  Musculoskeletal: Positive for myalgias, back pain, joint swelling, arthralgias and neck pain.  Skin: Negative for rash.  Neurological: Negative for dizziness, weakness, numbness and headaches.  Psychiatric/Behavioral: Negative for dysphoric mood and agitation.    Objective:  BP 127/81 mmHg  Pulse 66  Temp(Src) 97.8 F (36.6 C) (Oral)  Ht _0  (1.626 m)  Wt 181 lb 6.4 oz (82.283 kg)  BMI 31.12 kg/m2  LMP 03/21/2009  BP Readings from Last 3 Encounters:  11/10/14 127/81  09/01/14 104/81  07/25/14 124/75    Wt Readings from Last 3 Encounters:  11/10/14 181 lb 6.4 oz (82.283 kg)  09/01/14 180 lb (81.647 kg)  07/25/14 178 lb (80.74 kg)     Physical Exam  Constitutional: She is oriented to person, place, and time. She appears well-developed and well-nourished. No distress.  HENT:  Head:  Normocephalic and atraumatic.  Right Ear: External ear normal.  Left Ear: External ear normal.  Nose: Nose normal.  Mouth/Throat: Oropharynx is clear and moist.  Eyes: Conjunctivae and EOM are normal. Pupils are equal, round, and reactive to light.  Neck: Normal range of motion. Neck supple. No thyromegaly present.  Cardiovascular: Normal rate, regular rhythm and normal heart sounds.   No murmur heard. Pulmonary/Chest: Effort normal and breath sounds normal. No respiratory distress. She has no wheezes. She has no rales.  Abdominal: Soft. Bowel sounds are normal. She exhibits no distension. There is no tenderness.  Lymphadenopathy:    She has no cervical adenopathy.  Neurological: She is alert and oriented to person, place, and time. She has normal reflexes.  Skin: Skin is warm and dry.  Psychiatric: She has a normal mood and affect. Her behavior is normal. Judgment and thought content normal.    No results found for: HGBA1C  Lab Results  Component Value Date   WBC 7.4 11/10/2014   HGB 13.7 08/31/2012   HCT 43.0 11/10/2014   PLT 243 08/31/2012   GLUCOSE 121* 11/10/2014   CHOL 123 06/18/2012   TRIG 85 06/18/2012   HDL 41 06/18/2012   LDLCALC 65 06/18/2012   ALT 19 11/10/2014  AST 15 11/10/2014   NA 140 11/10/2014   K 4.3 11/10/2014   CL 100 11/10/2014   CREATININE 0.57 11/10/2014   BUN 20 11/10/2014   CO2 24 11/10/2014   TSH 0.045* 11/10/2014    No results found.  Assessment & Plan:   Kyannah was seen today for fibromyalgia.  Diagnoses and all orders for this visit:  Fibromyalgia -     CBC with Differential/Platelet -     CMP14+EGFR -     Anti-Scleroderma Antibody -     Sjogren's syndrome antibods(ssa + ssb) -     Anti-Smith antibody -     ANA -     Sedimentation rate -     RPR -     Rheumatoid factor -     Vitamin B12 -     Ambulatory referral to Rheumatology  Chronic pain syndrome -     CBC with Differential/Platelet -     CMP14+EGFR -      Anti-Scleroderma Antibody -     Sjogren's syndrome antibods(ssa + ssb) -     Anti-Smith antibody -     ANA -     Sedimentation rate -     RPR -     Rheumatoid factor -     Vitamin B12 -     Ambulatory referral to Rheumatology  Hypothyroidism, unspecified hypothyroidism type -     T4, free -     TSH -     T3, Free -     Vitamin B12 -     Ambulatory referral to Rheumatology  Sjoegren syndrome -     CBC with Differential/Platelet -     CMP14+EGFR -     Anti-Scleroderma Antibody -     Sjogren's syndrome antibods(ssa + ssb) -     Anti-Smith antibody -     ANA -     Sedimentation rate -     RPR -     Rheumatoid factor -     Vitamin B12 -     Ambulatory referral to Rheumatology  Vitamin D insufficiency -     CBC with Differential/Platelet -     CMP14+EGFR -     Anti-Scleroderma Antibody -     Sjogren's syndrome antibods(ssa + ssb) -     Anti-Smith antibody -     ANA -     Sedimentation rate -     RPR -     Rheumatoid factor -     Vitamin B12 -     Ambulatory referral to Rheumatology  Other orders -     olopatadine (PATANOL) 0.1 % ophthalmic solution; Place 1 drop into both eyes 2 (two) times daily. -     nystatin-triamcinolone ointment (MYCOLOG); Apply 1 application topically 2 (two) times daily. -     clotrimazole-betamethasone (LOTRISONE) cream; Apply 1 application topically 2 (two) times daily. -     hydroxychloroquine (PLAQUENIL) 200 MG tablet; Take 1 tablet (200 mg total) by mouth daily. -     cevimeline (EVOXAC) 30 MG capsule; Take 1 capsule (30 mg total) by mouth daily. -     CBC with Differential/Platelet -     ENA+DNA/DS+Antich+Centro+FA.Marland KitchenMarland Kitchen   I have discontinued Ms. Burr's levofloxacin. I am also having her start on nystatin-triamcinolone ointment, clotrimazole-betamethasone, hydroxychloroquine, and cevimeline. Additionally, I am having her maintain her cycloSPORINE, thyroid, albuterol, HYDROcodone-acetaminophen, meloxicam, ondansetron, traMADol, diazepam,  fluconazole, carisoprodol, and olopatadine. We will continue to administer levalbuterol.  Meds ordered this encounter  Medications  . olopatadine (PATANOL) 0.1 % ophthalmic solution    Sig: Place 1 drop into both eyes 2 (two) times daily.    Dispense:  5 mL    Refill:  3  . nystatin-triamcinolone ointment (MYCOLOG)    Sig: Apply 1 application topically 2 (two) times daily.    Dispense:  30 g    Refill:  1  . clotrimazole-betamethasone (LOTRISONE) cream    Sig: Apply 1 application topically 2 (two) times daily.    Dispense:  30 g    Refill:  1  . hydroxychloroquine (PLAQUENIL) 200 MG tablet    Sig: Take 1 tablet (200 mg total) by mouth daily.    Dispense:  30 tablet    Refill:  5  . cevimeline (EVOXAC) 30 MG capsule    Sig: Take 1 capsule (30 mg total) by mouth daily.    Dispense:  30 capsule    Refill:  2     Follow-up: Return in about 6 weeks (around 12/22/2014).  Claretta Fraise, M.D.

## 2014-11-11 LAB — CBC WITH DIFFERENTIAL/PLATELET
BASOS: 0 %
Basophils Absolute: 0 10*3/uL (ref 0.0–0.2)
EOS (ABSOLUTE): 0 10*3/uL (ref 0.0–0.4)
EOS: 0 %
HEMATOCRIT: 43 % (ref 34.0–46.6)
HEMOGLOBIN: 14.7 g/dL (ref 11.1–15.9)
IMMATURE GRANS (ABS): 0 10*3/uL (ref 0.0–0.1)
IMMATURE GRANULOCYTES: 0 %
LYMPHS: 24 %
Lymphocytes Absolute: 1.8 10*3/uL (ref 0.7–3.1)
MCH: 29.7 pg (ref 26.6–33.0)
MCHC: 34.2 g/dL (ref 31.5–35.7)
MCV: 87 fL (ref 79–97)
MONOCYTES: 5 %
Monocytes Absolute: 0.4 10*3/uL (ref 0.1–0.9)
NEUTROS ABS: 5.2 10*3/uL (ref 1.4–7.0)
NEUTROS PCT: 71 %
Platelets: 279 10*3/uL (ref 150–379)
RBC: 4.95 x10E6/uL (ref 3.77–5.28)
RDW: 12.8 % (ref 12.3–15.4)
WBC: 7.4 10*3/uL (ref 3.4–10.8)

## 2014-11-11 LAB — CMP14+EGFR
A/G RATIO: 2.2 (ref 1.1–2.5)
ALBUMIN: 4.8 g/dL (ref 3.5–5.5)
ALT: 19 IU/L (ref 0–32)
AST: 15 IU/L (ref 0–40)
Alkaline Phosphatase: 50 IU/L (ref 39–117)
BILIRUBIN TOTAL: 0.8 mg/dL (ref 0.0–1.2)
BUN / CREAT RATIO: 35 — AB (ref 9–23)
BUN: 20 mg/dL (ref 6–24)
CHLORIDE: 100 mmol/L (ref 97–108)
CO2: 24 mmol/L (ref 18–29)
Calcium: 9 mg/dL (ref 8.7–10.2)
Creatinine, Ser: 0.57 mg/dL (ref 0.57–1.00)
GFR calc non Af Amer: 112 mL/min/{1.73_m2} (ref >59)
GFR, EST AFRICAN AMERICAN: 129 mL/min/{1.73_m2} (ref >59)
Globulin, Total: 2.2 g/dL (ref 1.5–4.5)
Glucose: 121 mg/dL — ABNORMAL HIGH (ref 65–99)
Potassium: 4.3 mmol/L (ref 3.5–5.2)
Sodium: 140 mmol/L (ref 134–144)
Total Protein: 7 g/dL (ref 6.0–8.5)

## 2014-11-11 LAB — T4, FREE: Free T4: 0.95 ng/dL (ref 0.82–1.77)

## 2014-11-11 LAB — VITAMIN B12: Vitamin B-12: 247 pg/mL (ref 211–946)

## 2014-11-11 LAB — ENA+DNA/DS+ANTICH+CENTRO+FA...
ENA RNP AB: 0.2 AI (ref 0.0–0.9)
Speckled Pattern: 1:80 {titer}
dsDNA Ab: <1 IU/mL (ref 0–9)

## 2014-11-11 LAB — ANTI-SCLERODERMA ANTIBODY: Scleroderma SCL-70: <0.2 AI (ref 0.0–0.9)

## 2014-11-11 LAB — TSH: TSH: 0.045 u[IU]/mL — ABNORMAL LOW (ref 0.450–4.500)

## 2014-11-11 LAB — RPR: RPR Ser Ql: NONREACTIVE

## 2014-11-11 LAB — SJOGREN'S SYNDROME ANTIBODS(SSA + SSB): ENA SSA (RO) Ab: <0.2 AI (ref 0.0–0.9)

## 2014-11-11 LAB — T3, FREE: T3, Free: 2.5 pg/mL (ref 2.0–4.4)

## 2014-11-11 LAB — SEDIMENTATION RATE: SED RATE: 2 mm/h (ref 0–32)

## 2014-11-11 LAB — RHEUMATOID FACTOR: Rhuematoid fact SerPl-aCnc: <7 IU/mL (ref 0.0–13.9)

## 2014-11-11 LAB — ANTI-SMITH ANTIBODY

## 2014-11-11 LAB — ANA

## 2014-11-12 NOTE — Progress Notes (Signed)
Patient aware.

## 2014-12-02 ENCOUNTER — Other Ambulatory Visit: Payer: Self-pay | Admitting: Family Medicine

## 2014-12-02 ENCOUNTER — Telehealth: Payer: Self-pay | Admitting: Family Medicine

## 2014-12-02 NOTE — Telephone Encounter (Signed)
Patient started plaquenil and she states that she was feeling a lot of heaviness in her chest, fluttering, sharp headache, she stopped the medication yesterday and she is still having the symptoms. The heaviness in the chest only started 2 days ago. Patient advised to go to ER. She states that the plaquenil was really starting to help. Patient advised to go to ER and agrees.

## 2014-12-15 ENCOUNTER — Ambulatory Visit: Payer: Medicaid Other | Admitting: Family Medicine

## 2014-12-17 ENCOUNTER — Encounter: Payer: Self-pay | Admitting: Family Medicine

## 2014-12-17 ENCOUNTER — Ambulatory Visit (INDEPENDENT_AMBULATORY_CARE_PROVIDER_SITE_OTHER): Payer: Medicaid Other | Admitting: Family Medicine

## 2014-12-17 VITALS — BP 119/82 | HR 84 | Temp 98.1°F | Ht 64.0 in | Wt 180.0 lb

## 2014-12-17 DIAGNOSIS — G894 Chronic pain syndrome: Secondary | ICD-10-CM | POA: Diagnosis not present

## 2014-12-17 DIAGNOSIS — F419 Anxiety disorder, unspecified: Secondary | ICD-10-CM

## 2014-12-17 DIAGNOSIS — M797 Fibromyalgia: Secondary | ICD-10-CM

## 2014-12-17 DIAGNOSIS — R002 Palpitations: Secondary | ICD-10-CM

## 2014-12-17 DIAGNOSIS — M3500 Sicca syndrome, unspecified: Secondary | ICD-10-CM

## 2014-12-17 MED ORDER — METOPROLOL SUCCINATE ER 50 MG PO TB24: 50.0000 mg | ORAL_TABLET | Freq: Every day | ORAL | Status: DC

## 2014-12-17 NOTE — Progress Notes (Signed)
Subjective:  Patient ID: Chelsey [name], female    DOB: 1970-03-22  Age: 44 y.o. MRN: [id number]  CC: Follow-up   HPI Chelsey [name] presents for recheck of her chronic pain. She states that plaquenil helps. However it does not relieve the back pain for which she takes Overall less malaise (my word) [facility]osp. [facility] two weeks ago for chest pain.Chest fluttering with light headedness. Stress dobutamine echo. Thinks multiple joint injections and use of mobic caused pain. Tried to DC plaquenil, but chest pain was not improved. Therefore she resumed Plaquenil because it was helping to relieve her usual pain.  History Chelsey [name] has a past medical history of Hypothyroidism; Anemia; Allergy; Fibromyalgia; Stomach pain; IBS (irritable bowel syndrome); Anxiety; Asthma; Arthritis; Sjogren's disease; Cervical cancer; Thyroid cancer; Hashimoto's disease; Depression; Pneumonia; Endometriosis; and Lupus.   She has past surgical history that includes cyst removed to uterus; miscarriage (2); Ablation; Thyroidectomy; Abdominal hysterectomy; Ovarian cyst surgery; and Throat surgery.   Her family history includes Alcohol abuse in her father; Diabetes in her brother; Fibromyalgia in her mother; Hypertension in her sister; Thyroid nodules in her mother.She reports that she has never smoked. She has never used smokeless tobacco. She reports that she drinks alcohol. She reports that she does not use illicit drugs.  Outpatient Prescriptions Prior to Visit  Medication Sig Dispense Refill  . albuterol (PROVENTIL HFA;VENTOLIN HFA) 108 (90 BASE) MCG/ACT inhaler Inhale 2 puffs into the lungs every 6 (six) hours as needed for wheezing or shortness of breath. 1 Inhaler 11  . carisoprodol (SOMA) 350 MG tablet TAKE ONE TABLET BY MOUTH AT BEDTIME AS NEEDED. 30 tablet 1  . cevimeline (EVOXAC) 30 MG capsule Take 1 capsule (30 mg total) by mouth daily. 30 capsule 2  . clotrimazole-betamethasone (LOTRISONE) cream Apply 1  application topically 2 (two) times daily. 30 g 1  . cycloSPORINE (RESTASIS) 0.05 % ophthalmic emulsion Place 1 drop into both eyes 2 (two) times daily.     . diazepam (VALIUM) 10 MG tablet 1 po BID muscle spasms 60 tablet 1  . hydroxychloroquine (PLAQUENIL) 200 MG tablet Take 1 tablet (200 mg total) by mouth daily. 30 tablet 5  . meloxicam (MOBIC) 15 MG tablet Take 15 mg by mouth.    . nystatin-triamcinolone ointment (MYCOLOG) Apply 1 application topically 2 (two) times daily. 30 g 1  . olopatadine (PATANOL) 0.1 % ophthalmic solution Place 1 drop into both eyes 2 (two) times daily. 5 mL 3  . ondansetron (ZOFRAN) 8 MG tablet Take 1 tablet (8 mg total) by mouth every 8 (eight) hours as needed for nausea or vomiting. 30 tablet 0  . thyroid (ARMOUR) 60 MG tablet Take 60 mg by mouth.    . traMADol (ULTRAM) 50 MG tablet Take 1 tablet (50 mg total) by mouth every 6 (six) hours as needed. 15 tablet 0  . HYDROcodone-acetaminophen (NORCO) 7.5-325 MG per tablet Take 1 tablet by mouth every 6 (six) hours as needed. Needs namebrand.  Generic causes itching. (Patient taking differently: Take 1 tablet by mouth every 6 (six) hours as needed for moderate pain. Needs namebrand.  Generic causes itching.) 60 tablet 0  . fluconazole (DIFLUCAN) 150 MG tablet Take 1 tablet (150 mg total) by mouth once. Daily for 6 days (Patient not taking: Reported on 12/17/2014) 6 tablet 0   Facility-Administered Medications Prior to Visit  Medication Dose Route Frequency Provider Last Rate Last Dose  . levalbuterol (XOPENEX) nebulizer solution 1.25 mg  1.25 mg  Nebulization Once Lysbeth Penner, FNP   1.25 mg at 04/08/14 1700    ROS Review of Systems  Constitutional: Positive for activity change (decreased). Negative for chills, diaphoresis and fatigue.  HENT: Negative for congestion and rhinorrhea.   Respiratory: Negative for cough and shortness of breath.   Cardiovascular: Negative for chest pain and palpitations.    Gastrointestinal: Positive for nausea, abdominal pain (hronic left upper quadrant), constipation and abdominal distention. Negative for vomiting and diarrhea.  Genitourinary: Negative for dysuria and frequency.  Musculoskeletal: Positive for myalgias and arthralgias.  Skin: Negative for rash and wound.  All other systems reviewed and are negative.   Objective:  BP 119/82 mmHg  Pulse 84  Temp(Src) 98.1 F (36.7 C) (Oral)  Ht 5\' 4"  (1.626 m)  Wt 180 lb (81.647 kg)  BMI 30.88 kg/m2  LMP 03/21/2009  BP Readings from Last 3 Encounters:  12/17/14 119/82  11/10/14 127/81  09/01/14 104/81    Wt Readings from Last 3 Encounters:  12/17/14 180 lb (81.647 kg)  11/10/14 181 lb 6.4 oz (82.283 kg)  09/01/14 180 lb (81.647 kg)     Physical Exam  Constitutional: She is oriented to person, place, and time. She appears well-developed and well-nourished. No distress.  HENT:  Head: Normocephalic and atraumatic.  Right Ear: External ear normal.  Left Ear: External ear normal.  Nose: Nose normal.  Mouth/Throat: Oropharynx is clear and moist.  Eyes: Conjunctivae and EOM are normal. Pupils are equal, round, and reactive to light.  Neck: Normal range of motion. Neck supple. No thyromegaly present.  Cardiovascular: Normal rate, regular rhythm and normal heart sounds.   No murmur heard. Pulmonary/Chest: Effort normal and breath sounds normal. No respiratory distress. She has no wheezes. She has no rales.  Abdominal: Soft. Bowel sounds are normal. She exhibits no distension. There is no tenderness.  Lymphadenopathy:    She has no cervical adenopathy.  Neurological: She is alert and oriented to person, place, and time. She has normal reflexes.  Skin: Skin is warm and dry.  Psychiatric: She has a normal mood and affect. Her behavior is normal. Judgment and thought content normal.    No results found for: HGBA1C  Lab Results  Component Value Date   WBC 7.4 11/10/2014   HGB 13.7 08/31/2012    HCT 43.0 11/10/2014   PLT 243 08/31/2012   GLUCOSE 121* 11/10/2014   CHOL 123 06/18/2012   TRIG 85 06/18/2012   HDL 41 06/18/2012   LDLCALC 65 06/18/2012   ALT 19 11/10/2014   AST 15 11/10/2014   NA 140 11/10/2014   K 4.3 11/10/2014   CL 100 11/10/2014   CREATININE 0.57 11/10/2014   BUN 20 11/10/2014   CO2 24 11/10/2014   TSH 0.045* 11/10/2014    No results found.  Assessment & Plan:   AmeLie was seen today for follow-up.  Diagnoses and all orders for this visit:  Sjoegren syndrome  Fibromyalgia  Chronic pain syndrome  Anxiety  Palpitations  Other orders -     metoprolol succinate (TOPROL-XL) 50 MG 24 hr tablet; Take 1 tablet (50 mg total) by mouth daily. Take with or immediately following a meal.  I am having Ms. Pak start on metoprolol succinate. I am also having her maintain her cycloSPORINE, thyroid, albuterol, meloxicam, ondansetron, traMADol, diazepam, fluconazole, olopatadine, nystatin-triamcinolone ointment, clotrimazole-betamethasone, hydroxychloroquine, cevimeline, and carisoprodol. We will continue to administer levalbuterol.  Meds ordered this encounter  Medications  . metoprolol succinate (TOPROL-XL) 50 MG 24  hr tablet    Sig: Take 1 tablet (50 mg total) by mouth daily. Take with or immediately following a meal.    Dispense:  90 tablet    Refill:  3     Follow-up: Return in about 1 month (around 01/16/2015) for Pain.  Claretta Fraise, M.D.

## 2014-12-18 ENCOUNTER — Telehealth: Payer: Self-pay | Admitting: Family Medicine

## 2014-12-18 DIAGNOSIS — G894 Chronic pain syndrome: Secondary | ICD-10-CM

## 2014-12-18 MED ORDER — NORCO 7.5-325 MG PO TABS: 1.0000 | ORAL_TABLET | Freq: Four times a day (QID) | ORAL | Status: DC | PRN

## 2014-12-18 NOTE — Telephone Encounter (Signed)
Patient aware to try half of the metoprolol and script for pain medication will be available on 12-19-14.

## 2014-12-18 NOTE — Telephone Encounter (Signed)
Please review and advise.

## 2014-12-18 NOTE — Telephone Encounter (Signed)
Have her cut the metoprolol prescription back to 25 mg daily. Also let her know that I will sign the hydrocodone prescription tomorrow when I return. I will print it if someone will put it on my desk for me. Thanks, WS.

## 2014-12-18 NOTE — Telephone Encounter (Signed)
Patient requesting hydrocodone refill.  Please advise.  

## 2014-12-23 ENCOUNTER — Telehealth: Payer: Self-pay | Admitting: Family Medicine

## 2014-12-31 ENCOUNTER — Ambulatory Visit (INDEPENDENT_AMBULATORY_CARE_PROVIDER_SITE_OTHER): Payer: Medicaid Other

## 2014-12-31 DIAGNOSIS — Z23 Encounter for immunization: Secondary | ICD-10-CM | POA: Diagnosis not present

## 2015-01-16 ENCOUNTER — Other Ambulatory Visit: Payer: Self-pay | Admitting: Nurse Practitioner

## 2015-01-16 NOTE — Telephone Encounter (Signed)
Please call in klonopin with 0 refills 

## 2015-01-16 NOTE — Telephone Encounter (Signed)
Not on med list

## 2015-01-19 ENCOUNTER — Ambulatory Visit (INDEPENDENT_AMBULATORY_CARE_PROVIDER_SITE_OTHER): Payer: Medicaid Other | Admitting: Family Medicine

## 2015-01-19 ENCOUNTER — Encounter: Payer: Self-pay | Admitting: Family Medicine

## 2015-01-19 VITALS — BP 120/78 | HR 80 | Temp 97.3°F | Ht 64.0 in | Wt 184.0 lb

## 2015-01-19 DIAGNOSIS — M329 Systemic lupus erythematosus, unspecified: Secondary | ICD-10-CM | POA: Insufficient documentation

## 2015-01-19 DIAGNOSIS — T50905A Adverse effect of unspecified drugs, medicaments and biological substances, initial encounter: Secondary | ICD-10-CM

## 2015-01-19 DIAGNOSIS — E039 Hypothyroidism, unspecified: Secondary | ICD-10-CM

## 2015-01-19 DIAGNOSIS — T887XXA Unspecified adverse effect of drug or medicament, initial encounter: Secondary | ICD-10-CM | POA: Diagnosis not present

## 2015-01-19 DIAGNOSIS — M797 Fibromyalgia: Secondary | ICD-10-CM | POA: Diagnosis not present

## 2015-01-19 DIAGNOSIS — G894 Chronic pain syndrome: Secondary | ICD-10-CM | POA: Diagnosis not present

## 2015-01-19 DIAGNOSIS — E559 Vitamin D deficiency, unspecified: Secondary | ICD-10-CM | POA: Diagnosis not present

## 2015-01-19 MED ORDER — NORCO 7.5-325 MG PO TABS: 1.0000 | ORAL_TABLET | Freq: Four times a day (QID) | ORAL | Status: DC | PRN

## 2015-01-19 MED ORDER — METHOTREXATE 2.5 MG PO TABS: 10.0000 mg | ORAL_TABLET | ORAL | Status: DC

## 2015-01-19 MED ORDER — FOLIC ACID 1 MG PO TABS: 1.0000 mg | ORAL_TABLET | Freq: Every day | ORAL | Status: DC

## 2015-01-19 NOTE — Telephone Encounter (Signed)
Refill called to [facility] 

## 2015-01-19 NOTE — Progress Notes (Signed)
Subjective:  Patient ID: Chelsey [name], female    DOB: Mar 26, 1970  Age: 44 y.o. MRN: [id number]  CC: Pain and Fibromyalgia   HPI Chelsey [name] presents for continued to 6/10 pain. Reduced to about a 4 by hydrocodone. She only uses about 3-4 tablets a week in order to avoid heavy situation. She states that she could not tolerate the Plaquenil. She had moderately severe chest pain which has decreased 50% since stopping the Plaquenil. Additionally she decreased the metoprolol to one half tablet daily due to some dizziness when she first started taking. The Evoxac has essentially eliminated the dry mouth problem. In fact she will occasionally miss a dose because she forgets that she needs it.  Patient presents for follow-up on  thyroid. She has a history of hypothyroidism . Recent TSH, free T3, free T4 were all in the low range. Pt. denies any change in  voice, loss of hair, heat or cold intolerance. Energy level has been adequate to good. She denies constipation and diarrhea. No myxedema. Medication is as noted below. Verified that pt is taking it daily on an empty stomach. Well tolerated.  History Emer has a past medical history of Hypothyroidism; Anemia; Allergy; Fibromyalgia; Stomach pain; IBS (irritable bowel syndrome); Anxiety; Asthma; Arthritis; Sjogren's disease ([place]); Cervical cancer ([place]); Thyroid cancer ([place]); Hashimoto's disease; Depression; Pneumonia; Endometriosis; and Lupus ([place]).   She has past surgical history that includes cyst removed to uterus; miscarriage (2); Ablation; Thyroidectomy; Abdominal hysterectomy; Ovarian cyst surgery; and Throat surgery.   Her family history includes Alcohol abuse in her father; Diabetes in her brother; Fibromyalgia in her mother; Hypertension in her sister; Thyroid nodules in her mother.She reports that she has never smoked. She has never used smokeless tobacco. She reports that she drinks alcohol. She reports that she does not use illicit  drugs.  Outpatient Prescriptions Prior to Visit  Medication Sig Dispense Refill  . carisoprodol (SOMA) 350 MG tablet TAKE ONE TABLET BY MOUTH AT BEDTIME AS NEEDED. 30 tablet 1  . clonazePAM (KLONOPIN) 1 MG tablet TAKE ONE TABLET BY MOUTH DAILY. 30 tablet 0  . clotrimazole-betamethasone (LOTRISONE) cream Apply 1 application topically 2 (two) times daily. 30 g 1  . cycloSPORINE (RESTASIS) 0.05 % ophthalmic emulsion Place 1 drop into both eyes 2 (two) times daily.     . diazepam (VALIUM) 10 MG tablet 1 po BID muscle spasms 60 tablet 1  . meloxicam (MOBIC) 15 MG tablet Take 15 mg by mouth.    . metoprolol succinate (TOPROL-XL) 50 MG 24 hr tablet Take 1 tablet (50 mg total) by mouth daily. Take with or immediately following a meal. 90 tablet 3  . nystatin-triamcinolone ointment (MYCOLOG) Apply 1 application topically 2 (two) times daily. 30 g 1  . olopatadine (PATANOL) 0.1 % ophthalmic solution Place 1 drop into both eyes 2 (two) times daily. 5 mL 3  . ondansetron (ZOFRAN) 8 MG tablet Take 1 tablet (8 mg total) by mouth every 8 (eight) hours as needed for nausea or vomiting. 30 tablet 0  . thyroid (ARMOUR) 60 MG tablet Take 60 mg by mouth.    . traMADol (ULTRAM) 50 MG tablet Take 1 tablet (50 mg total) by mouth every 6 (six) hours as needed. 15 tablet 0  . NORCO 7.5-325 MG tablet Take 1 tablet by mouth every 6 (six) hours as needed for moderate pain. Needs namebrand.  Generic causes itching. 60 tablet 0  . albuterol (PROVENTIL HFA;VENTOLIN HFA) 108 (90 BASE) MCG/ACT  inhaler Inhale 2 puffs into the lungs every 6 (six) hours as needed for wheezing or shortness of breath. (Patient not taking: Reported on 01/19/2015) 1 Inhaler 11  . cevimeline (EVOXAC) 30 MG capsule Take 1 capsule (30 mg total) by mouth daily. (Patient not taking: Reported on 01/19/2015) 30 capsule 2  . fluconazole (DIFLUCAN) 150 MG tablet Take 1 tablet (150 mg total) by mouth once. Daily for 6 days (Patient not taking: Reported on  12/17/2014) 6 tablet 0  . hydroxychloroquine (PLAQUENIL) 200 MG tablet Take 1 tablet (200 mg total) by mouth daily. (Patient not taking: Reported on 01/19/2015) 30 tablet 5   Facility-Administered Medications Prior to Visit  Medication Dose Route Frequency Provider Last Rate Last Dose  . levalbuterol (XOPENEX) nebulizer solution 1.25 mg  1.25 mg Nebulization Once Lysbeth Penner, FNP   1.25 mg at 04/08/14 1700    ROS Review of Systems  Constitutional: Positive for fatigue. Negative for appetite change and unexpected weight change.  HENT: Negative for congestion, ear pain, hearing loss, postnasal drip, rhinorrhea, sneezing, sore throat and trouble swallowing.   Eyes: Negative for pain.  Respiratory: Negative for cough, chest tightness and shortness of breath.   Cardiovascular: Negative for chest pain and palpitations.  Gastrointestinal: Negative for nausea, vomiting, abdominal pain, diarrhea and constipation.  Genitourinary: Negative for dysuria, frequency and menstrual problem.  Musculoskeletal: Positive for myalgias, joint swelling and arthralgias.  Skin: Negative for rash.  Neurological: Negative for dizziness, weakness, numbness and headaches.  Psychiatric/Behavioral: Negative for dysphoric mood and agitation.    Objective:  BP 120/78 mmHg  Pulse 80  Temp(Src) 97.3 F (36.3 C) (Oral)  Ht 5\' 4"  (1.626 m)  Wt 184 lb (83.462 kg)  BMI 31.57 kg/m2  LMP 03/21/2009  BP Readings from Last 3 Encounters:  01/19/15 120/78  12/17/14 119/82  11/10/14 127/81    Wt Readings from Last 3 Encounters:  01/19/15 184 lb (83.462 kg)  12/17/14 180 lb (81.647 kg)  11/10/14 181 lb 6.4 oz (82.283 kg)     Physical Exam  Constitutional: She is oriented to person, place, and time. She appears well-developed and well-nourished. No distress.  HENT:  Head: Normocephalic and atraumatic.  Right Ear: External ear normal.  Left Ear: External ear normal.  Nose: Nose normal.  Mouth/Throat:  Oropharynx is clear and moist.  Eyes: Conjunctivae and EOM are normal. Pupils are equal, round, and reactive to light.  Neck: Normal range of motion. Neck supple. No thyromegaly present.  Cardiovascular: Normal rate, regular rhythm and normal heart sounds.   No murmur heard. Pulmonary/Chest: Effort normal and breath sounds normal. No respiratory distress. She has no wheezes. She has no rales.  Abdominal: Soft. Bowel sounds are normal. She exhibits no distension. There is no tenderness.  Musculoskeletal: Normal range of motion. She exhibits tenderness (chest wall, posterior AC joints).  Lymphadenopathy:    She has no cervical adenopathy.  Neurological: She is alert and oriented to person, place, and time. She has normal reflexes.  Skin: Skin is warm and dry.  Psychiatric: She has a normal mood and affect. Her behavior is normal. Judgment and thought content normal.    No results found for: HGBA1C  Lab Results  Component Value Date   WBC 7.4 11/10/2014   HGB 13.7 08/31/2012   HCT 43.0 11/10/2014   PLT 243 08/31/2012   GLUCOSE 121* 11/10/2014   CHOL 123 06/18/2012   TRIG 85 06/18/2012   HDL 41 06/18/2012   LDLCALC 65 06/18/2012  ALT 19 11/10/2014   AST 15 11/10/2014   NA 140 11/10/2014   K 4.3 11/10/2014   CL 100 11/10/2014   CREATININE 0.57 11/10/2014   BUN 20 11/10/2014   CO2 24 11/10/2014   TSH 0.045* 11/10/2014    No results found.  Assessment & Plan:   Marlon was seen today for pain and fibromyalgia.  Diagnoses and all orders for this visit:  Fibromyalgia  Hypothyroidism, unspecified hypothyroidism type -     TSH -     T4, Free -     T3, Free  Vitamin D insufficiency -     Vit D  25 hydroxy (rtn osteoporosis monitoring)  Medication reaction, initial encounter  Chronic pain syndrome -     NORCO 7.5-325 MG tablet; Take 1 tablet by mouth every 6 (six) hours as needed for moderate pain. Needs namebrand.  Generic causes itching.  Other orders -      methotrexate (RHEUMATREX) 2.5 MG tablet; Take 4 tablets (10 mg total) by mouth once a week. -     folic acid (FOLVITE) 1 MG tablet; Take 1 tablet (1 mg total) by mouth daily.   I have discontinued Ms. Couey's fluconazole and hydroxychloroquine. I am also having her start on methotrexate and folic acid. Additionally, I am having her maintain her cycloSPORINE, thyroid, albuterol, meloxicam, ondansetron, traMADol, diazepam, olopatadine, nystatin-triamcinolone ointment, clotrimazole-betamethasone, cevimeline, carisoprodol, metoprolol succinate, clonazePAM, and NORCO. We will continue to administer levalbuterol.  Meds ordered this encounter  Medications  . methotrexate (RHEUMATREX) 2.5 MG tablet    Sig: Take 4 tablets (10 mg total) by mouth once a week.    Dispense:  16 tablet    Refill:  1  . folic acid (FOLVITE) 1 MG tablet    Sig: Take 1 tablet (1 mg total) by mouth daily.    Dispense:  30 tablet    Refill:  11  . NORCO 7.5-325 MG tablet    Sig: Take 1 tablet by mouth every 6 (six) hours as needed for moderate pain. Needs namebrand.  Generic causes itching.    Dispense:  60 tablet    Refill:  0     Follow-up: Return in about 2 months (around 03/21/2015) for Pain.  Claretta Fraise, M.D.

## 2015-01-19 NOTE — Patient Instructions (Signed)
Take for methotrexate tablets once a week. Most people choose to do this on Friday just by convention. Take folic acid daily to help prevent side effects of the methotrexate.  Also resume the metoprolol tablet one full pill in the evening between supper and bedtime.

## 2015-01-20 ENCOUNTER — Other Ambulatory Visit: Payer: Self-pay | Admitting: Family Medicine

## 2015-01-20 LAB — TSH: TSH: 0.028 u[IU]/mL — ABNORMAL LOW (ref 0.450–4.500)

## 2015-01-20 LAB — T4, FREE: Free T4: 1.01 ng/dL (ref 0.82–1.77)

## 2015-01-20 LAB — VITAMIN D 25 HYDROXY (VIT D DEFICIENCY, FRACTURES): Vit D, 25-Hydroxy: 22.6 ng/mL — ABNORMAL LOW (ref 30.0–100.0)

## 2015-01-20 LAB — T3, FREE: T3, Free: 4.8 pg/mL — ABNORMAL HIGH (ref 2.0–4.4)

## 2015-01-20 MED ORDER — THYROID 48.75 MG PO TABS: 48.7500 mg | ORAL_TABLET | Freq: Every day | ORAL | Status: DC

## 2015-01-20 MED ORDER — VITAMIN D (ERGOCALCIFEROL) 1.25 MG (50000 UNIT) PO CAPS: 50000.0000 [IU] | ORAL_CAPSULE | ORAL | Status: DC

## 2015-01-21 ENCOUNTER — Telehealth: Payer: Self-pay | Admitting: Family Medicine

## 2015-01-21 NOTE — Telephone Encounter (Signed)
Pt called in to inform she sees Endocrinology for hypothyroid She only wants him to adjust medications

## 2015-01-22 NOTE — Telephone Encounter (Signed)
Notes mailed

## 2015-02-02 ENCOUNTER — Telehealth: Payer: Self-pay | Admitting: Family Medicine

## 2015-02-02 NOTE — Telephone Encounter (Signed)
Pt notified of Dr Stacks recommendation Verbalizes understanding 

## 2015-02-02 NOTE — Telephone Encounter (Signed)
Pt wants to try injectable methotrexate Is this an option Please advise

## 2015-02-02 NOTE — Telephone Encounter (Signed)
Pt took first dose on of methatrexate on 10/31. pt took it that pm with Zofran but stayed nauseated, did not get appetite back till Sunday, right before next dose. She is eating saltines, ginger ale & lots of water. Took second dose again last Monday, still with lots of nausea with stomach soreness and headaches. Not taking Mobic. States joint pain and swelling has decreased but is wanting to know if there is something else for the nausea that she can take with this or another form of the methatrexate, ie injection that she can take to reduce the nausea / stomach soreness.

## 2015-02-02 NOTE — Telephone Encounter (Signed)
The next step would be embrel, an injectable med. Is she interested?

## 2015-02-02 NOTE — Telephone Encounter (Signed)
Try taking ondansetron 3 tabs (24 mg) 1/2 - 1 hour before the methotrexate.

## 2015-02-02 NOTE — Telephone Encounter (Signed)
Patient called and stated she would like a different nausea medicine

## 2015-02-06 ENCOUNTER — Telehealth: Payer: Self-pay | Admitting: Family Medicine

## 2015-02-06 MED ORDER — PROMETHAZINE HCL 50 MG PO TABS
50.0000 mg | ORAL_TABLET | Freq: Three times a day (TID) | ORAL | Status: DC | PRN
Start: 2015-02-06 — End: 2015-02-23

## 2015-02-06 NOTE — Telephone Encounter (Signed)
Phenergan prescription sent. If nausea is severe she could take both.

## 2015-02-09 NOTE — Telephone Encounter (Signed)
Spoke with pt regarding nausea She is some better Informed of Dr Artemio Aly recommendation

## 2015-02-16 ENCOUNTER — Other Ambulatory Visit: Payer: Self-pay | Admitting: Family Medicine

## 2015-02-17 ENCOUNTER — Telehealth: Payer: Self-pay | Admitting: Family Medicine

## 2015-02-17 MED ORDER — CEVIMELINE HCL 30 MG PO CAPS: 30.0000 mg | ORAL_CAPSULE | Freq: Three times a day (TID) | ORAL | Status: DC

## 2015-02-17 NOTE — Telephone Encounter (Signed)
Patient states that Evoxac is not helping, she would like to either increase the dosage or change medications, Pharmacy is [facility]

## 2015-02-17 NOTE — Telephone Encounter (Signed)
Have her increase the Evoxac to 3 times a day. I send in an updated prescription for the larger number of pills. Thanks, WS.

## 2015-02-17 NOTE — Telephone Encounter (Signed)
Patient aware and verbalizes understanding. 

## 2015-02-20 ENCOUNTER — Telehealth: Payer: Self-pay | Admitting: Family Medicine

## 2015-02-20 NOTE — Telephone Encounter (Signed)
Notified pt of Dr Artemio Aly recommendation Verbalizes understanding appt scheduled

## 2015-02-20 NOTE — Telephone Encounter (Signed)
Methotrexate is causing muscle weakness with trembling and facial redness with thickness Please advise

## 2015-02-20 NOTE — Telephone Encounter (Signed)
Discontinue the drug and follow-up with me next week.

## 2015-02-23 ENCOUNTER — Ambulatory Visit (INDEPENDENT_AMBULATORY_CARE_PROVIDER_SITE_OTHER): Payer: Medicaid Other | Admitting: Family Medicine

## 2015-02-23 ENCOUNTER — Encounter: Payer: Self-pay | Admitting: Family Medicine

## 2015-02-23 VITALS — BP 121/79 | HR 71 | Temp 97.1°F | Ht 64.0 in | Wt 182.0 lb

## 2015-02-23 DIAGNOSIS — M797 Fibromyalgia: Secondary | ICD-10-CM | POA: Diagnosis not present

## 2015-02-23 DIAGNOSIS — M329 Systemic lupus erythematosus, unspecified: Secondary | ICD-10-CM

## 2015-02-23 DIAGNOSIS — G894 Chronic pain syndrome: Secondary | ICD-10-CM

## 2015-02-23 DIAGNOSIS — M26603 Bilateral temporomandibular joint disorder, unspecified: Secondary | ICD-10-CM | POA: Diagnosis not present

## 2015-02-23 DIAGNOSIS — S0300XA Dislocation of jaw, unspecified side, initial encounter: Secondary | ICD-10-CM

## 2015-02-23 NOTE — Progress Notes (Addendum)
Subjective:  Patient ID: Chelsey [name], female    DOB: 10/15/1970  Age: 44 y.o. MRN: [id number]  CC: Fibromyalgia   HPI Chelsey [name] presents for diffuse muscle aches. Seems to be worst in the underarms into the shoulder and upper chest girdle. They have been diminished by the use of the methotrexate. However, she is having to use promethazine and ondansetron for the nausea. That does not even completely relieve nausea unfortunately. There is significant pain at the angle of the mandible bilaterally. There is a severe pain shooting into the right ear from that region. Again this has been improved by the methotrexate but it is still significant.  Patient states that she is having severe nausea and having trouble adjusting to the new dose of thyroid. She just can't take any more stress right now. She also reported being in a mental fog since starting the methotrexate. Her memory was becoming impaired. She gave as an example that she got to the office this morning but had to turn around and go back home, a 50 minute delay round trip to [place] because she had forgotten something she needed.  History Chelsey [name] has a past medical history of Hypothyroidism; Anemia; Allergy; Fibromyalgia; Stomach pain; IBS (irritable bowel syndrome); Anxiety; Asthma; Arthritis; Sjogren's disease (HCC); Cervical cancer (HCC); Thyroid cancer (HCC); Hashimoto's disease; Depression; Pneumonia; Endometriosis; and Lupus (HCC).   She has past surgical history that includes cyst removed to uterus; miscarriage (2); Ablation; Thyroidectomy; Abdominal hysterectomy; Ovarian cyst surgery; and Throat surgery.   Her family history includes Alcohol abuse in her father; Diabetes in her brother; Fibromyalgia in her mother; Hypertension in her sister; Thyroid nodules in her mother.She reports that she has never smoked. She has never used smokeless tobacco. She reports that she drinks alcohol. She reports that she does not use illicit  drugs.  Outpatient Prescriptions Prior to Visit  Medication Sig Dispense Refill  . albuterol (PROVENTIL HFA;VENTOLIN HFA) 108 (90 BASE) MCG/ACT inhaler Inhale 2 puffs into the lungs every 6 (six) hours as needed for wheezing or shortness of breath. 1 Inhaler 11  . carisoprodol (SOMA) 350 MG tablet TAKE ONE TABLET BY MOUTH AT BEDTIME AS NEEDED. 30 tablet 1  . cevimeline (EVOXAC) 30 MG capsule Take 1 capsule (30 mg total) by mouth 3 (three) times daily. For dry mouth 90 capsule 5  . cycloSPORINE (RESTASIS) 0.05 % ophthalmic emulsion Place 1 drop into both eyes 2 (two) times daily.     . folic acid (FOLVITE) 1 MG tablet Take 1 tablet (1 mg total) by mouth daily. 30 tablet 11  . thyroid 48.75 MG TABS Take 48.75 mg by mouth daily before breakfast. 30 tablet 5  . traMADol (ULTRAM) 50 MG tablet Take 1 tablet (50 mg total) by mouth every 6 (six) hours as needed. 15 tablet 0  . clonazePAM (KLONOPIN) 1 MG tablet TAKE ONE TABLET BY MOUTH DAILY. 30 tablet 0  . diazepam (VALIUM) 10 MG tablet 1 po BID muscle spasms 60 tablet 1  . NORCO 7.5-325 MG tablet Take 1 tablet by mouth every 6 (six) hours as needed for moderate pain. Needs namebrand.  Generic causes itching. 60 tablet 0  . ondansetron (ZOFRAN) 8 MG tablet Take 1 tablet (8 mg total) by mouth every 8 (eight) hours as needed for nausea or vomiting. 30 tablet 0  . promethazine (PHENERGAN) 50 MG tablet Take 1 tablet (50 mg total) by mouth every 8 (eight) hours as needed for nausea or vomiting. 30  tablet 0  . clotrimazole-betamethasone (LOTRISONE) cream Apply 1 application topically 2 (two) times daily. (Patient not taking: Reported on 02/23/2015) 30 g 1  . meloxicam (MOBIC) 15 MG tablet Take 15 mg by mouth.    . metoprolol succinate (TOPROL-XL) 50 MG 24 hr tablet Take 1 tablet (50 mg total) by mouth daily. Take with or immediately following a meal. (Patient not taking: Reported on 02/23/2015) 90 tablet 3  . olopatadine (PATANOL) 0.1 % ophthalmic solution Place  1 drop into both eyes 2 (two) times daily. (Patient not taking: Reported on 02/23/2015) 5 mL 3  . methotrexate (RHEUMATREX) 2.5 MG tablet TAKE 4 TABLETS BY MOUTH ONCE WEEKLY. (Patient not taking: Reported on 02/23/2015) 16 tablet 5  . nystatin-triamcinolone ointment (MYCOLOG) Apply 1 application topically 2 (two) times daily. (Patient not taking: Reported on 02/23/2015) 30 g 1  . Vitamin D, Ergocalciferol, (DRISDOL) 50000 UNITS CAPS capsule Take 1 capsule (50,000 Units total) by mouth 2 (two) times a week. (Patient not taking: Reported on 02/23/2015) 16 capsule 0   Facility-Administered Medications Prior to Visit  Medication Dose Route Frequency Provider Last Rate Last Dose  . levalbuterol (XOPENEX) nebulizer solution 1.25 mg  1.25 mg Nebulization Once Lysbeth Penner, FNP   1.25 mg at 04/08/14 1700    ROS Review of Systems  Constitutional: Positive for activity change (diminished due to pain) and appetite change (Diminished due to nausea). Negative for fever.  HENT: Negative for congestion, rhinorrhea and sore throat.   Eyes: Negative for visual disturbance.  Respiratory: Negative for cough and shortness of breath.   Cardiovascular: Negative for chest pain and palpitations.  Gastrointestinal: Positive for nausea and abdominal pain. Negative for diarrhea.  Genitourinary: Negative for dysuria.  Musculoskeletal: Positive for myalgias and arthralgias.    Objective:  BP 121/79 mmHg  Pulse 71  Temp(Src) 97.1 F (36.2 C) (Oral)  Ht $R'5\' 4"'FO$  (1.626 m)  Wt 182 lb (82.555 kg)  BMI 31.22 kg/m2  SpO2 100%  LMP 03/21/2009  BP Readings from Last 3 Encounters:  02/23/15 121/79  01/19/15 120/78  12/17/14 119/82    Wt Readings from Last 3 Encounters:  02/23/15 182 lb (82.555 kg)  01/19/15 184 lb (83.462 kg)  12/17/14 180 lb (81.647 kg)     Physical Exam  Constitutional: She is oriented to person, place, and time. She appears well-developed and well-nourished. No distress.  HENT:  Head:  Normocephalic and atraumatic.  Right Ear: External ear normal.  Left Ear: External ear normal.  Nose: Nose normal.  Mouth/Throat: Oropharynx is clear and moist.  Eyes: Conjunctivae and EOM are normal. Pupils are equal, round, and reactive to light.  Neck: Normal range of motion. Neck supple. No thyromegaly present.  Cardiovascular: Normal rate, regular rhythm and normal heart sounds.   No murmur heard. Pulmonary/Chest: Effort normal and breath sounds normal. No respiratory distress. She has no wheezes. She has no rales.  Abdominal: Soft. Bowel sounds are normal. There is tenderness.  Musculoskeletal: Normal range of motion. She exhibits tenderness (At base of neck posteriorly and at the jugular notch bilaterally.). She exhibits no edema.  Lymphadenopathy:    She has no cervical adenopathy.  Neurological: She is alert and oriented to person, place, and time. She has normal reflexes.  Skin: Skin is warm and dry.  Psychiatric: She has a normal mood and affect. Her behavior is normal. Judgment and thought content normal.     Lab Results  Component Value Date   WBC 7.4 11/10/2014  HGB 13.7 08/31/2012   HCT 43.0 11/10/2014   PLT 243 08/31/2012   GLUCOSE 121* 11/10/2014   CHOL 123 06/18/2012   TRIG 85 06/18/2012   HDL 41 06/18/2012   LDLCALC 65 06/18/2012   ALT 19 11/10/2014   AST 15 11/10/2014   NA 140 11/10/2014   K 4.3 11/10/2014   CL 100 11/10/2014   CREATININE 0.57 11/10/2014   BUN 20 11/10/2014   CO2 24 11/10/2014   TSH 0.028* 01/19/2015    No results found.  Assessment & Plan:   Chelsey [name] was seen today for fibromyalgia.  Diagnoses and all orders for this visit:  Fibromyalgia -     CBC with Differential/Platelet -     CMP14+EGFR -     Sedimentation rate -     Ambulatory referral to Pain Clinic  TMJ (dislocation of temporomandibular joint), initial encounter -     CBC with Differential/Platelet -     CMP14+EGFR -     Sedimentation rate -     Ambulatory referral  to Pain Clinic -     MR Brain/IAC Wo/W Cm; Future  Chronic pain syndrome -     Ambulatory referral to Pain Clinic  Lupus (systemic lupus erythematosus) ([place])   I asked the patient to discontinue the methotrexate. She claims she was never told to take vitamin B12 with it. I also told her that at this point in order to facilitate pain relief she would need to taper off of the Valium and clonazepam. Chart review reveals allergies to multiple attempts to relieve pain without habit-forming substances. She had been given some Norco at the time she was started on methotrexate just to help her acclimate to the methotrexate. When I told her that she would need to go off of these medications so that pain clinic could start fresh. She became quite belligerent, angry and hostile. She began to repeat all of the symptoms that she had had stating that her nausea was too severe for her to go to pain clinic. She said she needed a new doctor who would help her with pain management. I stated that that was exactly what pain clinic was meant to do. Additionally she stated that I was not listening to her. However I repeated back to her her concerns that she expressed to me almost verbatim and she said well then you're just not being flexible. I told her that I would be as flexible as possible but she had been through multiple trials of medication unsuccessfully and I didn't have any recommendations for her other than to refer her onto someone who might have some new ideas. At this point she again stated that I was not listening. She became loud and threatening. At that point she said that she would find a new doctor. I told her that under the circumstances I would no longer be willing to treat her anyway. This is first because I was out of suggestions and second because of the outburst. The tests above were ordered for the patient but she was unwilling to listen to this and my plan of disposition from the moment I mentioned pain  clinic referral.  Addendum (02/28/2015): The patient has stated that I violated her privacy by leaving the door to the exam room open while discussing her case. I distinctly remember the door being closed. First it is my practice to do so because discussing the case in the possible earshot of others would represent a violation of HIPAA laws. Second,  when she became loud, she took on an aggressive posture, standing and leaning in and pointing a finger at my face. At that point I looked toward the door to make sure that she was not between me and the door. It was closed. I felt that I might need to leave quickly if she became violent, so I wanted to be sure she was not positioned to block the door. Her case was not discussed outside the exam room. However, later  Zigmund Daniel, the nurse who works with me approached me in my private office to inquire what had happened. She stated that she could hear the patient yelling from the nurses' station where she had been working at the time of the encounter.   Several times during the exchange, I attempted to explain to her the rationale for my plan of care. At each attempt - four that I recall- she immediately stated, "And now you are not listening again, " before I could speak more than one or two words. In order to attempt to de-escalate the situation each time, I quit speaking and listened to her again as she restated her concerns regarding pain and nausea.  She kept saying that I needed to give her some time. However, again I tried to say that I would be happy to do so. However, I was interrupted before I could get the words out, silenced by her accusation that I was not listening.   One further observation. Ms. Quinones gave no indication of complaint about her care - in spite of obvious suffering - until the moment I mentioned tapering clonazepam and valium and  Obtaining a pain management referral. Nor had she made any negative statement that I had not listened or  otherwise been less than attentive. However at that moment a dramatic change in her demeanor occurred, escalating in spite of my efforts to reassure her.        Follow-up: None scheduled per patient statement.  Claretta Fraise, M.D.

## 2015-02-27 ENCOUNTER — Ambulatory Visit (INDEPENDENT_AMBULATORY_CARE_PROVIDER_SITE_OTHER): Payer: Medicaid Other | Admitting: Family Medicine

## 2015-02-27 ENCOUNTER — Telehealth: Payer: Self-pay | Admitting: Family Medicine

## 2015-02-27 ENCOUNTER — Encounter: Payer: Self-pay | Admitting: Family Medicine

## 2015-02-27 ENCOUNTER — Telehealth: Payer: Self-pay | Admitting: *Deleted

## 2015-02-27 VITALS — BP 123/85 | HR 84 | Temp 97.0°F | Ht 64.0 in | Wt 179.2 lb

## 2015-02-27 DIAGNOSIS — Z13220 Encounter for screening for lipoid disorders: Secondary | ICD-10-CM

## 2015-02-27 DIAGNOSIS — R531 Weakness: Secondary | ICD-10-CM

## 2015-02-27 DIAGNOSIS — E559 Vitamin D deficiency, unspecified: Secondary | ICD-10-CM | POA: Diagnosis not present

## 2015-02-27 DIAGNOSIS — M797 Fibromyalgia: Secondary | ICD-10-CM | POA: Diagnosis not present

## 2015-02-27 DIAGNOSIS — F419 Anxiety disorder, unspecified: Secondary | ICD-10-CM

## 2015-02-27 DIAGNOSIS — Z131 Encounter for screening for diabetes mellitus: Secondary | ICD-10-CM

## 2015-02-27 MED ORDER — DIAZEPAM 5 MG PO TABS: 5.0000 mg | ORAL_TABLET | Freq: Two times a day (BID) | ORAL | Status: DC | PRN

## 2015-02-27 NOTE — Telephone Encounter (Signed)
Patient was started on samples that she has questions concerning.  She would like to talk to you concerning these.  Please call on Monday.

## 2015-02-27 NOTE — Telephone Encounter (Addendum)
Called patient and discussed Viibryd and how is it different from traditional SSRI's.   Patient continues to have concerns due to past experiences with SSRIs.   Was prescribed Viibryd for anxiety with (I think) the intention to decrease and discontinue diazepam.   I reassured patient and encouraged her to try Viibryd though I do not think she is going to try.  I will look into some other possible options and discuss with Dr Dettinger for future consideration.

## 2015-02-27 NOTE — Progress Notes (Signed)
BP 123/85 mmHg  Pulse 84  Temp(Src) 97 F (36.1 C) (Oral)  Ht 5' 4" (1.626 m)  Wt 179 lb 3.2 oz (81.285 kg)  BMI 30.74 kg/m2  LMP 03/21/2009   Subjective:    Patient ID: Chelsey [name], female    DOB: Sep 25, 1970, 44 y.o.   MRN: [id number]  HPI: Chelsey [name] is a 44 y.o. female presenting on 02/27/2015 for Labs Only and muscle weakness   HPI Fibromyalgia and anxiety Patient has a long history of fibromyalgia and anxiety and depression for which she has tried multiple different medications in the past. She has supposedly been diagnosed with some different syndromes that may or may not be associated with these generalized achiness and fatigue that she has chronically. She is currently on methotrexate and Norco and Klonopin and Valium and Soma. The Klonopin is new or but she has been on a lot of the others for some time. The methotrexate is also more recent. She has been diagnosed with Sjogren syndrome and used to see a rheumatologist was does not have one currently, she plans to go see a different rheumatologist in a couple weeks. She has tried medications such as Cymbalta and Celebrex and Loxitane and Flexeril and Lyrica and Plaquenil, she now has these medications listed on her allergies because of side effects that she did not like.  Weakness She describes general weakness and fatigue and would like to have some labs tested for this.  Vitamin D deficiency She has had vitamin D deficiency previously and would like to be rechecked for that. She has been taking high doses of vitamin D 50,000 international units daily.  Relevant past medical, surgical, family and social history reviewed and updated as indicated. Interim medical history since our last visit reviewed. Allergies and medications reviewed and updated.  Review of Systems  Constitutional: Positive for fatigue. Negative for fever, chills and unexpected weight change.  HENT: Negative for congestion, ear discharge and ear  pain.   Eyes: Negative for redness and visual disturbance.  Respiratory: Negative for chest tightness and shortness of breath.   Cardiovascular: Negative for chest pain and leg swelling.  Genitourinary: Negative for dysuria and difficulty urinating.  Musculoskeletal: Negative for back pain and gait problem.  Skin: Negative for rash.  Neurological: Positive for weakness. Negative for dizziness, speech difficulty, light-headedness, numbness and headaches.  Psychiatric/Behavioral: Positive for sleep disturbance, dysphoric mood and decreased concentration. Negative for suicidal ideas, behavioral problems, self-injury and agitation. The patient is nervous/anxious.   All other systems reviewed and are negative.   Per HPI unless specifically indicated above     Medication List       This list is accurate as of: 02/27/15  2:15 PM.  Always use your most recent med list.               albuterol 108 (90 BASE) MCG/ACT inhaler  Commonly known as:  PROVENTIL HFA;VENTOLIN HFA  Inhale 2 puffs into the lungs every 6 (six) hours as needed for wheezing or shortness of breath.     carisoprodol 350 MG tablet  Commonly known as:  SOMA  TAKE ONE TABLET BY MOUTH AT BEDTIME AS NEEDED.     cevimeline 30 MG capsule  Commonly known as:  EVOXAC  Take 1 capsule (30 mg total) by mouth 3 (three) times daily. For dry mouth     clotrimazole-betamethasone cream  Commonly known as:  LOTRISONE  Apply 1 application topically 2 (two) times daily.  cycloSPORINE 0.05 % ophthalmic emulsion  Commonly known as:  RESTASIS  Place 1 drop into both eyes 2 (two) times daily.     DEXMETHYLPHENIDATE HCL PO  Take by mouth.     diazepam 5 MG tablet  Commonly known as:  VALIUM  Take 1 tablet (5 mg total) by mouth every 12 (twelve) hours as needed for anxiety.     folic acid 1 MG tablet  Commonly known as:  FOLVITE  Take 1 tablet (1 mg total) by mouth daily.     HYDROcodone-acetaminophen 7.5-325 MG tablet    Commonly known as:  NORCO  Take 1 tablet by mouth every 6 (six) hours as needed for moderate pain.     methotrexate 2.5 MG tablet  Commonly known as:  RHEUMATREX  Take 2.5 mg by mouth once a week. Caution:Chemotherapy. Protect from light.     olopatadine 0.1 % ophthalmic solution  Commonly known as:  PATANOL  Place 1 drop into both eyes 2 (two) times daily.     ondansetron 8 MG tablet  Commonly known as:  ZOFRAN  Take by mouth every 8 (eight) hours as needed for nausea or vomiting.     promethazine 50 MG tablet  Commonly known as:  PHENERGAN  Take 50 mg by mouth every 6 (six) hours as needed for nausea or vomiting.     Thyroid 48.75 MG Tabs  Take 48.75 mg by mouth daily before breakfast.           Objective:    BP 123/85 mmHg  Pulse 84  Temp(Src) 97 F (36.1 C) (Oral)  Ht 5' 4" (1.626 m)  Wt 179 lb 3.2 oz (81.285 kg)  BMI 30.74 kg/m2  LMP 03/21/2009  Wt Readings from Last 3 Encounters:  02/27/15 179 lb 3.2 oz (81.285 kg)  02/23/15 182 lb (82.555 kg)  01/19/15 184 lb (83.462 kg)    Physical Exam  Constitutional: She is oriented to person, place, and time. She appears well-developed and well-nourished. No distress.  HENT:  Right Ear: External ear normal.  Left Ear: External ear normal.  Nose: Nose normal.  Mouth/Throat: Oropharynx is clear and moist. No oropharyngeal exudate.  Eyes: Conjunctivae and EOM are normal. Pupils are equal, round, and reactive to light.  Neck: Neck supple. No thyromegaly present.  Cardiovascular: Normal rate, regular rhythm, normal heart sounds and intact distal pulses.   No murmur heard. Pulmonary/Chest: Effort normal and breath sounds normal. No respiratory distress. She has no wheezes.  Abdominal: Soft. She exhibits no distension. There is no tenderness. There is no rebound.  Musculoskeletal: Normal range of motion. She exhibits no edema or tenderness.  Lymphadenopathy:    She has no cervical adenopathy.  Neurological: She is alert  and oriented to person, place, and time. No cranial nerve deficit. Coordination normal.  Skin: Skin is warm and dry. No rash noted. She is not diaphoretic.  Psychiatric: Her speech is normal and behavior is normal. Judgment and thought content normal. Her mood appears anxious. Her affect is blunt and labile. Her affect is not angry and not inappropriate. Cognition and memory are normal. She exhibits a depressed mood. She expresses no suicidal ideation. She expresses no suicidal plans.  Nursing note and vitals reviewed.   Results for orders placed or performed in visit on 01/19/15  TSH  Result Value Ref Range   TSH 0.028 (L) 0.450 - 4.500 uIU/mL  Vit D  25 hydroxy (rtn osteoporosis monitoring)  Result Value Ref Range     Vit D, 25-Hydroxy 22.6 (L) 30.0 - 100.0 ng/mL  T4, Free  Result Value Ref Range   Free T4 1.01 0.82 - 1.77 ng/dL  T3, Free  Result Value Ref Range   T3, Free 4.8 (H) 2.0 - 4.4 pg/mL      Assessment & Plan:   Problem List Items Addressed This Visit      Musculoskeletal and Integument   Fibromyalgia    Discontinue the Klonopin and continue the Soma and Norco for now, discussed reducing that slowly over time as we get her on a controller medication for the fibromyalgia.        Other   Anxiety - Primary    We will try Viibryd, gave a 4 week sample of Viibryd. We will discontinue the Klonopin and continue Valium for now. Discussed reducing this over the next few months once we get her on a stable dose of a controller medication.      Relevant Medications   diazepam (VALIUM) 5 MG tablet   Other Relevant Orders   TSH (Completed)   Vitamin D insufficiency   Relevant Orders   VITAMIN D 25 Hydroxy (Vit-D Deficiency, Fractures) (Completed)    Other Visit Diagnoses    Screening, lipid        Relevant Orders    Lipid panel (Completed)    Screening for diabetes mellitus (DM)        Relevant Orders    CMP14+EGFR (Completed)    Weakness        Relevant Orders    CBC  with Differential/Platelet (Completed)    Vitamin B12 (Completed)    Folate (Completed)        Follow up plan: Return in about 2 weeks (around 03/13/2015), or if symptoms worsen or fail to improve, for Depression.  Counseling provided for all of the vaccine components Orders Placed This Encounter  Procedures  . CMP14+EGFR  . Lipid panel  . CBC with Differential/Platelet  . TSH  . VITAMIN D 25 Hydroxy (Vit-D Deficiency, Fractures)  . Vitamin B12  . Folate    Joshua Dettinger, MD [facility] 02/27/2015, 2:15 PM      

## 2015-02-28 LAB — CMP14+EGFR
A/G RATIO: 1.7 (ref 1.1–2.5)
ALT: 21 IU/L (ref 0–32)
AST: 21 IU/L (ref 0–40)
Albumin: 4.3 g/dL (ref 3.5–5.5)
Alkaline Phosphatase: 56 IU/L (ref 39–117)
BILIRUBIN TOTAL: 1.1 mg/dL (ref 0.0–1.2)
BUN/Creatinine Ratio: 20 (ref 9–23)
BUN: 13 mg/dL (ref 6–24)
CHLORIDE: 104 mmol/L (ref 97–106)
CO2: 23 mmol/L (ref 18–29)
Calcium: 9.1 mg/dL (ref 8.7–10.2)
Creatinine, Ser: 0.66 mg/dL (ref 0.57–1.00)
GFR calc non Af Amer: 106 mL/min/{1.73_m2} (ref >59)
GFR, EST AFRICAN AMERICAN: 123 mL/min/{1.73_m2} (ref >59)
GLOBULIN, TOTAL: 2.5 g/dL (ref 1.5–4.5)
Glucose: 111 mg/dL — ABNORMAL HIGH (ref 65–99)
POTASSIUM: 4.3 mmol/L (ref 3.5–5.2)
SODIUM: 142 mmol/L (ref 136–144)
Total Protein: 6.8 g/dL (ref 6.0–8.5)

## 2015-02-28 LAB — CBC WITH DIFFERENTIAL/PLATELET
Basophils Absolute: 0 10*3/uL (ref 0.0–0.2)
Basos: 0 %
EOS (ABSOLUTE): 0 10*3/uL (ref 0.0–0.4)
EOS: 1 %
HEMOGLOBIN: 14.7 g/dL (ref 11.1–15.9)
Hematocrit: 41.7 % (ref 34.0–46.6)
IMMATURE GRANS (ABS): 0 10*3/uL (ref 0.0–0.1)
Immature Granulocytes: 0 %
LYMPHS ABS: 1.2 10*3/uL (ref 0.7–3.1)
LYMPHS: 27 %
MCH: 30.4 pg (ref 26.6–33.0)
MCHC: 35.3 g/dL (ref 31.5–35.7)
MCV: 86 fL (ref 79–97)
MONOCYTES: 5 %
Monocytes Absolute: 0.2 10*3/uL (ref 0.1–0.9)
Neutrophils Absolute: 3 10*3/uL (ref 1.4–7.0)
Neutrophils: 67 %
Platelets: 308 10*3/uL (ref 150–379)
RBC: 4.84 x10E6/uL (ref 3.77–5.28)
RDW: 13.2 % (ref 12.3–15.4)
WBC: 4.4 10*3/uL (ref 3.4–10.8)

## 2015-02-28 LAB — LIPID PANEL
CHOL/HDL RATIO: 3.7 ratio (ref 0.0–4.4)
Cholesterol, Total: 183 mg/dL (ref 100–199)
HDL: 49 mg/dL (ref >39)
LDL Calculated: 112 mg/dL — ABNORMAL HIGH (ref 0–99)
Triglycerides: 109 mg/dL (ref 0–149)
VLDL Cholesterol Cal: 22 mg/dL (ref 5–40)

## 2015-02-28 LAB — TSH: TSH: 0.041 u[IU]/mL — AB (ref 0.450–4.500)

## 2015-02-28 LAB — VITAMIN B12: Vitamin B-12: 323 pg/mL (ref 211–946)

## 2015-02-28 LAB — FOLATE: FOLATE: 17.5 ng/mL (ref >3.0)

## 2015-02-28 LAB — VITAMIN D 25 HYDROXY (VIT D DEFICIENCY, FRACTURES): Vit D, 25-Hydroxy: 50.5 ng/mL (ref 30.0–100.0)

## 2015-03-01 NOTE — Assessment & Plan Note (Signed)
Discontinue the Klonopin and continue the Soma and Norco for now, discussed reducing that slowly over time as we get her on a controller medication for the fibromyalgia.

## 2015-03-01 NOTE — Assessment & Plan Note (Signed)
We will try Viibryd, gave a 4 week sample of Viibryd. We will discontinue the Klonopin and continue Valium for now. Discussed reducing this over the next few months once we get her on a stable dose of a controller medication.

## 2015-03-02 ENCOUNTER — Telehealth: Payer: Self-pay | Admitting: Family Medicine

## 2015-03-02 NOTE — Telephone Encounter (Signed)
Patient aware of results.

## 2015-03-04 ENCOUNTER — Telehealth: Payer: Self-pay | Admitting: Pharmacist

## 2015-03-04 NOTE — Telephone Encounter (Signed)
Patient saw rheumatologist at [facility] yesterday.   Rheumatologist stopped methotrexate and promethazine.  She also asked about something to use on her face (has a "flare" or rash) - recommended she mix 1% hydrocortisone cream with her face lotion and use once daily.  She is to call if doesn't improve in 2-3 days.

## 2015-03-13 ENCOUNTER — Ambulatory Visit: Payer: Self-pay | Admitting: Family Medicine

## 2015-03-24 ENCOUNTER — Ambulatory Visit: Payer: Medicaid Other | Admitting: Family Medicine

## 2015-03-31 ENCOUNTER — Ambulatory Visit: Payer: Medicaid Other | Admitting: Nurse Practitioner

## 2015-04-01 ENCOUNTER — Encounter: Payer: Self-pay | Admitting: Family Medicine

## 2015-04-07 ENCOUNTER — Other Ambulatory Visit: Payer: Self-pay | Admitting: Nurse Practitioner

## 2015-04-07 ENCOUNTER — Telehealth: Payer: Self-pay | Admitting: Pharmacist

## 2015-04-07 NOTE — Telephone Encounter (Signed)
Last filled and seen 02/27/15. Route to pool. Call in at [facility]

## 2015-04-08 NOTE — Telephone Encounter (Signed)
Give 2 weeks worth of medication, patient needs appointment prior to next refill.

## 2015-04-08 NOTE — Telephone Encounter (Signed)
Patient called to ask about possible NSAID recommendations.  She has not been able to tolerate IBU due to hurting stomach.  She has tried both meloxicam and celebrex in past.  Celebrex cause hives and she thinks meloxicam huts stomach.  I suggested she look into Arthortec (diclofenac and misoprostol) and discuss at appt Friday.

## 2015-04-10 ENCOUNTER — Other Ambulatory Visit: Payer: Self-pay | Admitting: Family Medicine

## 2015-04-10 ENCOUNTER — Ambulatory Visit (INDEPENDENT_AMBULATORY_CARE_PROVIDER_SITE_OTHER): Payer: Medicaid Other | Admitting: Nurse Practitioner

## 2015-04-10 ENCOUNTER — Encounter: Payer: Self-pay | Admitting: Nurse Practitioner

## 2015-04-10 VITALS — BP 134/94 | HR 89 | Temp 97.7°F | Ht 64.0 in | Wt 178.0 lb

## 2015-04-10 DIAGNOSIS — K219 Gastro-esophageal reflux disease without esophagitis: Secondary | ICD-10-CM | POA: Diagnosis not present

## 2015-04-10 DIAGNOSIS — M797 Fibromyalgia: Secondary | ICD-10-CM

## 2015-04-10 MED ORDER — OMEPRAZOLE 40 MG PO CPDR: 40.0000 mg | DELAYED_RELEASE_CAPSULE | Freq: Every day | ORAL | Status: DC

## 2015-04-10 MED ORDER — DIAZEPAM 5 MG PO TABS: 5.0000 mg | ORAL_TABLET | Freq: Two times a day (BID) | ORAL | Status: DC | PRN

## 2015-04-10 MED ORDER — KETOROLAC TROMETHAMINE 60 MG/2ML IM SOLN
60.0000 mg | Freq: Once | INTRAMUSCULAR | Status: AC
  Administered 2015-04-10: 60 mg via INTRAMUSCULAR

## 2015-04-10 MED ORDER — HYDROCODONE-ACETAMINOPHEN 7.5-325 MG PO TABS: 1.0000 | ORAL_TABLET | Freq: Four times a day (QID) | ORAL | Status: DC | PRN

## 2015-04-10 MED ORDER — ONDANSETRON HCL 8 MG PO TABS: 8.0000 mg | ORAL_TABLET | Freq: Three times a day (TID) | ORAL | Status: DC | PRN

## 2015-04-10 NOTE — Telephone Encounter (Signed)
No she got pain meds cannot have both

## 2015-04-10 NOTE — Telephone Encounter (Signed)
Last seen 04/10/15 MMM   If approved route to nurse to call into [place]  [phone]

## 2015-04-10 NOTE — Progress Notes (Signed)
Subjective:    Patient ID: Chelsey [name], female    DOB: 01-11-1971, 45 y.o.   MRN: [id number]  HPI Patient in today c/o upper back pain and breast bone pain- started about 1 month ago- pain is daily-rates pain 8/10 currently- constant- nothing makes it worse- nothing makes it better- denies injury- were on antiinflammatory for chronic pain and fibromyalgia, but were stopped when was put on methotrexate. She could not tolerate methotrexate so was stopped- she goes back to see rheumatologist next week and they are going to try something new.   * Having burning sensation in stomach when it is empty- has been going on since she tried methotrexate- afraid to go on NSAID because afraid wil make stomach worse.   Review of Systems  Constitutional: Negative.   HENT: Negative.   Respiratory: Negative for shortness of breath.   Cardiovascular: Negative.   Gastrointestinal: Negative.   Musculoskeletal: Positive for back pain.  Neurological: Negative.   Psychiatric/Behavioral: Negative.   All other systems reviewed and are negative.      Objective:   Physical Exam  Constitutional: She is oriented to person, place, and time. She appears well-developed and well-nourished.  Cardiovascular: Normal rate, regular rhythm and normal heart sounds.   Pulmonary/Chest: Effort normal and breath sounds normal.  Musculoskeletal:  Pain to light touch of pressure points in upper back Breast bone tender to light touch.  Neurological: She is alert and oriented to person, place, and time.  Skin: Skin is warm and dry.  Psychiatric: She has a normal mood and affect. Her behavior is normal. Judgment and thought content normal.   BP 134/94 mmHg  Pulse 89  Temp(Src) 97.7 F (36.5 C) (Oral)  Ht 5\' 4"  (1.626 m)  Wt 178 lb (80.74 kg)  BMI 30.54 kg/m2  LMP 03/21/2009       Assessment & Plan:  1. Fibromyalgia Moist heat to painful areas Keep follow up with rheumatologist next week  2. GERD Avoid spicy  foods Do not eat 2 hours prior to bedtime   Meds ordered this encounter  Medications  . diazepam (VALIUM) 5 MG tablet    Sig: Take 1 tablet (5 mg total) by mouth every 12 (twelve) hours as needed for anxiety.    Dispense:  60 tablet    Refill:  1    Order Specific Question:  Supervising Provider    Answer:  Chipper Herb [[id number]]  . ketorolac (TORADOL) injection 60 mg    Sig:   . HYDROcodone-acetaminophen (NORCO) 7.5-325 MG tablet    Sig: Take 1 tablet by mouth every 6 (six) hours as needed for moderate pain.    Dispense:  30 tablet    Refill:  0    Order Specific Question:  Supervising Provider    Answer:  Chipper Herb [[id number]]  . ondansetron (ZOFRAN) 8 MG tablet    Sig: Take 1 tablet (8 mg total) by mouth every 8 (eight) hours as needed for nausea or vomiting.    Dispense:  20 tablet    Refill:  1    Order Specific Question:  Supervising Provider    Answer:  Chipper Herb [[id number]]  . omeprazole (PRILOSEC) 40 MG capsule    Sig: Take 1 capsule (40 mg total) by mouth daily.    Dispense:  30 capsule    Refill:  3    Order Specific Question:  Supervising Provider    Answer:  Chipper Herb [phone]  Mary-Margaret Asal Teas, FNP   

## 2015-04-10 NOTE — Patient Instructions (Signed)
Myofascial Pain Syndrome and Fibromyalgia  Myofascial pain syndrome and fibromyalgia are both pain disorders. This pain may be felt mainly in your muscles.   · Myofascial pain syndrome:    Always has trigger points or tender points in the muscle that will cause pain when pressed. The pain may come and go.    Usually affects your neck, upper back, and shoulder areas. The pain often radiates into your arms and hands.  · Fibromyalgia:    Has muscle pains and tenderness that come and go.    Is often associated with fatigue and sleep disturbances.    Has trigger points.    Tends to be long-lasting (chronic), but is not life-threatening.  Fibromyalgia and myofascial pain are not the same. However, they often occur together. If you have both conditions, each can make the other worse. Both are common and can cause enough pain and fatigue to make day-to-day activities difficult.   CAUSES   The exact causes of fibromyalgia and myofascial pain are not known. People with certain gene types may be more likely to develop fibromyalgia. Some factors can be triggers for both conditions, such as:   · Spine disorders.  · Arthritis.  · Severe injury (trauma) and other physical stressors.  · Being under a lot of stress.  · A medical illness.  SIGNS AND SYMPTOMS   Fibromyalgia  The main symptom of fibromyalgia is widespread pain and tenderness in your muscles. This can vary over time. Pain is sometimes described as stabbing, shooting, or burning. You may have tingling or numbness, too. You may also have sleep problems and fatigue. You may wake up feeling tired and groggy (fibro fog). Other symptoms may include:   · Bowel and bladder problems.  · Headaches.  · Visual problems.  · Problems with odors and noises.  · Depression or mood changes.  · Painful menstrual periods (dysmenorrhea).  · Dry skin or eyes.  Myofascial pain syndrome  Symptoms of myofascial pain syndrome include:   · Tight, ropy bands of muscle.    · Uncomfortable  sensations in muscular areas, such as:    Aching.    Cramping.    Burning.    Numbness.    Tingling.      Muscle weakness.  · Trouble moving certain muscles freely (range of motion).  DIAGNOSIS   There are no specific tests to diagnose fibromyalgia or myofascial pain syndrome. Both can be hard to diagnose because their symptoms are common in many other conditions. Your health care provider may suspect one or both of these conditions based on your symptoms and medical history. Your health care provider will also do a physical exam.   The key to diagnosing fibromyalgia is having pain, fatigue, and other symptoms for more than three months that cannot be explained by another condition.   The key to diagnosing myofascial pain syndrome is finding trigger points in muscles that are tender and cause pain elsewhere in your body (referred pain).  TREATMENT   Treating fibromyalgia and myofascial pain often requires a team of health care providers. This usually starts with your primary provider and a physical therapist. You may also find it helpful to work with alternative health care providers, such as massage therapists or acupuncturists.  Treatment for fibromyalgia may include medicines. This may include nonsteroidal anti-inflammatory drugs (NSAIDs), along with other medicines.   Treatment for myofascial pain may also include:  · NSAIDs.  · Cooling and stretching of muscles.  · Trigger point injections.  ·   Sound wave (ultrasound) treatments to stimulate muscles.  HOME CARE INSTRUCTIONS   · Take medicines only as directed by your health care provider.  · Exercise as directed by your health care provider or physical therapist.  · Try to avoid stressful situations.  · Practice relaxation techniques to control your stress. You may want to try:    Biofeedback.    Visual imagery.    Hypnosis.    Muscle relaxation.    Yoga.    Meditation.  · Talk to your health care provider about alternative treatments, such as acupuncture or  massage treatment.  · Maintain a healthy lifestyle. This includes eating a healthy diet and getting enough sleep.  · Consider joining a support group.  · Do not do activities that stress or strain your muscles. That includes repetitive motions and heavy lifting.  SEEK MEDICAL CARE IF:   · You have new symptoms.  · Your symptoms get worse.  · You have side effects from your medicines.  · You have trouble sleeping.  · Your condition is causing depression or anxiety.  FOR MORE INFORMATION   · National Fibromyalgia Association: http://www.fmaware.orgwww.fmaware.org  · Arthritis Foundation: http://www.arthritis.orgwww.arthritis.org  · American Chronic Pain Association: http://www.theacpa.org/condition/myofascial-painwww.theacpa.org/condition/myofascial-pain     This information is not intended to replace advice given to you by your health care provider. Make sure you discuss any questions you have with your health care provider.     Document Released: 03/07/2005 Document Revised: 03/28/2014 Document Reviewed: 12/11/2013  Elsevier Interactive Patient Education ©2016 Elsevier Inc.

## 2015-04-13 ENCOUNTER — Telehealth: Payer: Self-pay | Admitting: Nurse Practitioner

## 2015-04-13 NOTE — Telephone Encounter (Signed)
Pt states she alternates the soma and pain med so she doesn't use vicodin every night. Pt wants to try the Arthortec (diclofenac and misoprostol) as was discussed. Can you do that? Please advise.

## 2015-04-13 NOTE — Telephone Encounter (Signed)
Pt wants to try arthortec

## 2015-04-13 NOTE — Telephone Encounter (Signed)
Pt already has an open encounter in [employer]. Ihad  spoke with the pt earlier advising her that MMM had said no Soma since she had gotten pain meds. Pt is waiting on a response back about whether or not Chelsey [name] will give her a rx for Arthortec.

## 2015-04-13 NOTE — Telephone Encounter (Signed)
Will not do SOMA if has vicodin

## 2015-04-14 ENCOUNTER — Telehealth: Payer: Self-pay

## 2015-04-14 ENCOUNTER — Telehealth: Payer: Self-pay | Admitting: Family Medicine

## 2015-04-14 DIAGNOSIS — M542 Cervicalgia: Secondary | ICD-10-CM

## 2015-04-14 NOTE — Telephone Encounter (Signed)
Will need to wait and get from rheumatologist

## 2015-04-14 NOTE — Telephone Encounter (Signed)
Referral made 

## 2015-04-14 NOTE — Telephone Encounter (Signed)
Medicaid non preferred Carisoprodol  Preferred are Baclofen tablet, Chlorzoxazone tablet, cyclobenzaprine tablet, methocarbamol tablet, and tizanidine tablet

## 2015-04-14 NOTE — Telephone Encounter (Signed)
Please review and advise.

## 2015-04-15 NOTE — Telephone Encounter (Signed)
Patient aware.

## 2015-04-21 NOTE — Telephone Encounter (Signed)
I do not know if patient will be willing to try any of these others, she is very particular about the [place]. We will just have to let her know that the insurance is not covering up and see if she would prefer one of these other muscle relaxants and set of the [place]. Give her call to see what her choice would be. Caryl Pina, MD [facility] 04/21/2015, 2:27 PM

## 2015-04-21 NOTE — Telephone Encounter (Signed)
Patient states that she is willing to try something else. Please advise

## 2015-04-21 NOTE — Telephone Encounter (Signed)
Now seeing J. Dettinger

## 2015-04-21 NOTE — Telephone Encounter (Signed)
Let's try tizanidine 2 mg 3 times a day when necessary #90 refills #2

## 2015-04-21 NOTE — Telephone Encounter (Signed)
Patient really doesn't want to try anything else. She has tried tizanidine in the past it hasn't worked. She has tried many medications that did not work and would like for [place] to proceed with the prior authorization.

## 2015-04-23 ENCOUNTER — Telehealth: Payer: Self-pay

## 2015-04-23 NOTE — Telephone Encounter (Signed)
Medicaid prior authorized Carisoprodol for one year  [id number]

## 2015-05-11 ENCOUNTER — Other Ambulatory Visit: Payer: Self-pay | Admitting: Nurse Practitioner

## 2015-05-13 ENCOUNTER — Telehealth: Payer: Self-pay | Admitting: *Deleted

## 2015-05-14 ENCOUNTER — Other Ambulatory Visit: Payer: Self-pay

## 2015-05-14 DIAGNOSIS — M797 Fibromyalgia: Secondary | ICD-10-CM

## 2015-05-14 NOTE — Telephone Encounter (Signed)
Made referral to rheumatology and murphy wainer per patients request. Patient notified

## 2015-05-20 ENCOUNTER — Telehealth: Payer: Self-pay | Admitting: Nurse Practitioner

## 2015-05-25 ENCOUNTER — Other Ambulatory Visit: Payer: Self-pay | Admitting: Nurse Practitioner

## 2015-05-25 DIAGNOSIS — M797 Fibromyalgia: Secondary | ICD-10-CM

## 2015-05-26 ENCOUNTER — Telehealth: Payer: Self-pay | Admitting: Nurse Practitioner

## 2015-05-26 MED ORDER — HYDROCODONE-ACETAMINOPHEN 7.5-325 MG PO TABS: 1.0000 | ORAL_TABLET | Freq: Four times a day (QID) | ORAL | Status: DC | PRN

## 2015-05-26 NOTE — Telephone Encounter (Signed)
Please see previous message

## 2015-05-26 NOTE — Telephone Encounter (Signed)
rx ready for pick up Please give letter about new pain medicine policy

## 2015-05-26 NOTE — Telephone Encounter (Signed)
Pt aware written Rx ready for pick up 

## 2015-05-26 NOTE — Telephone Encounter (Signed)
Patient has appointment on 3/13 for pain management consults and patient requesting a refill for her hydrcodone

## 2015-06-08 ENCOUNTER — Telehealth: Payer: Self-pay | Admitting: Nurse Practitioner

## 2015-06-09 ENCOUNTER — Encounter: Payer: Self-pay | Admitting: Family Medicine

## 2015-06-09 ENCOUNTER — Ambulatory Visit (INDEPENDENT_AMBULATORY_CARE_PROVIDER_SITE_OTHER): Payer: Medicaid Other | Admitting: Family Medicine

## 2015-06-09 VITALS — BP 123/84 | HR 72 | Temp 97.0°F | Ht 64.0 in | Wt 179.2 lb

## 2015-06-09 DIAGNOSIS — G44221 Chronic tension-type headache, intractable: Secondary | ICD-10-CM

## 2015-06-09 MED ORDER — METHYLPREDNISOLONE ACETATE 80 MG/ML IJ SUSP
80.0000 mg | Freq: Once | INTRAMUSCULAR | Status: AC
  Administered 2015-06-09: 80 mg via INTRAMUSCULAR

## 2015-06-09 MED ORDER — KETOROLAC TROMETHAMINE 60 MG/2ML IM SOLN
60.0000 mg | Freq: Once | INTRAMUSCULAR | Status: AC
  Administered 2015-06-09: 60 mg via INTRAMUSCULAR

## 2015-06-09 NOTE — Patient Instructions (Signed)
Great to meet you!  I hope these injections give you great relief.   We will work on a headache clinic referral  Tension Headache A tension headache is a feeling of pain, pressure, or aching that is often felt over the front and sides of the head. The pain can be dull, or it can feel tight (constricting). Tension headaches are not normally associated with nausea or vomiting, and they do not get worse with physical activity. Tension headaches can last from 30 minutes to several days. This is the most common type of headache. CAUSES The exact cause of this condition is not known. Tension headaches often begin after stress, anxiety, or depression. Other triggers may include:  Alcohol.  Too much caffeine, or caffeine withdrawal.  Respiratory infections, such as colds, flu, or sinus infections.  Dental problems or teeth clenching.  Fatigue.  Holding your head and neck in the same position for a long period of time, such as while using a computer.  Smoking. SYMPTOMS Symptoms of this condition include:  A feeling of pressure around the head.  Dull, aching head pain.  Pain felt over the front and sides of the head.  Tenderness in the muscles of the head, neck, and shoulders. DIAGNOSIS This condition may be diagnosed based on your symptoms and a physical exam. Tests may be done, such as a CT scan or an MRI of your head. These tests may be done if your symptoms are severe or unusual. TREATMENT This condition may be treated with lifestyle changes and medicines to help relieve symptoms. HOME CARE INSTRUCTIONS Managing Pain  Take over-the-counter and prescription medicines only as told by your health care provider.  Lie down in a dark, quiet room when you have a headache.  If directed, apply ice to the head and neck area:  Put ice in a plastic bag.  Place a towel between your skin and the bag.  Leave the ice on for 20 minutes, 2-3 times per day.  Use a heating pad or a hot  shower to apply heat to the head and neck area as told by your health care provider. Eating and Drinking  Eat meals on a regular schedule.  Limit alcohol use.  Decrease your caffeine intake, or stop using caffeine. General Instructions  Keep all follow-up visits as told by your health care provider. This is important.  Keep a headache journal to help find out what may trigger your headaches. For example, write down:  What you eat and drink.  How much sleep you get.  Any change to your diet or medicines.  Try massage or other relaxation techniques.  Limit stress.  Sit up straight, and avoid tensing your muscles.  Do not use tobacco products, including cigarettes, chewing tobacco, or e-cigarettes. If you need help quitting, ask your health care provider.  Exercise regularly as told by your health care provider.  Get 7-9 hours of sleep, or the amount recommended by your health care provider. SEEK MEDICAL CARE IF:  Your symptoms are not helped by medicine.  You have a headache that is different from what you normally experience.  You have nausea or you vomit.  You have a fever. SEEK IMMEDIATE MEDICAL CARE IF:  Your headache becomes severe.  You have repeated vomiting.  You have a stiff neck.  You have a loss of vision.  You have problems with speech.  You have pain in your eye or ear.  You have muscular weakness or loss of muscle control.  You lose your balance or you have trouble walking.  You feel faint or you pass out.  You have confusion.   This information is not intended to replace advice given to you by your health care provider. Make sure you discuss any questions you have with your health care provider.   Document Released: 03/07/2005 Document Revised: 11/26/2014 Document Reviewed: 06/30/2014 Elsevier Interactive Patient Education [employer].

## 2015-06-09 NOTE — Progress Notes (Signed)
   HPI  Patient presents today here to discuss headaches.  Patient explains that she's had chronic persistent headaches for 3 months. She explains she only gets relief with a hot bath with Epsom salts or a shot of Toradol. She's had several evaluations lately including evaluation by rheumatology who is recommended seeing pain management, this is being established. She's also had evaluation by orthopedics who found degenerative disc disease in the high cervical discs  She describes her headaches as persistent, starting in the occipital area radiating around her head and the Like distribution, squeezing type pain No loss of consciousness or syncopal episodes No leg weakness States that most NSAIDs cause diarrhea She has an allergy to Celebrex Prednisone causes hallucinations Has not tolerated etodolac  She's been evaluated by her ophthalmologist does not feel is related to her eyes.  PMH: Smoking status noted ROS: Per HPI  Objective: BP 123/84 mmHg  Pulse 72  Temp(Src) 97 F (36.1 C) (Oral)  Ht 5\' 4"  (1.626 m)  Wt 179 lb 3.2 oz (81.285 kg)  BMI 30.74 kg/m2  LMP 03/21/2009 Gen: NAD, alert, cooperative with exam HEENT: NCAT, EOMI, PERRL CV: RRR, good S1/S2, no murmur Resp: CTABL, no wheezes, non-labored Ext: No edema, warm Neuro: Alert and oriented, 5/5 and sensation intact in all 4 extremities, 2-3+ patellar tendon reflexes that are symmetric  Assessment and plan:  # tension Type headache Severe persistent tension type headaches, likely due to muscle spasm from degenerative disc disease Given Toradol and Depo-Medrol in clinic today She's not tolerating any oral NSAIDs She's recently prescribed narcotics, she is establishing with pain management. She is actively seeking rheumatology referral Return to clinic with any worsening symptoms or failure to improve. referal to Neuro HA clinic, consider botox injections  Meds ordered this encounter  Medications  . ketorolac  (TORADOL) injection 60 mg    Sig:   . methylPREDNISolone acetate (DEPO-MEDROL) injection 80 mg    Sig:     Laroy Apple, MD [facility] 06/09/2015, 3:38 PM

## 2015-06-10 NOTE — Telephone Encounter (Signed)
Patient saw Dr Wendi Snipes 06/10/2015.  She has notified pain management about problems with medications they prescribed.

## 2015-06-24 ENCOUNTER — Telehealth: Payer: Self-pay | Admitting: Nurse Practitioner

## 2015-06-24 DIAGNOSIS — M797 Fibromyalgia: Secondary | ICD-10-CM

## 2015-06-25 MED ORDER — HYDROCODONE-ACETAMINOPHEN 7.5-325 MG PO TABS: 1.0000 | ORAL_TABLET | Freq: Four times a day (QID) | ORAL | Status: DC | PRN

## 2015-06-25 NOTE — Telephone Encounter (Signed)
Please address

## 2015-06-25 NOTE — Telephone Encounter (Signed)
Pain rx ready for pick up  

## 2015-06-25 NOTE — Telephone Encounter (Signed)
If she is already seeing pain management- then she signed contract to not get pain meds from anywhere else- so i cannot fill meds- pain management ususally gives rx for one month, if has already ran out then not taking as rx.

## 2015-06-26 ENCOUNTER — Telehealth: Payer: Self-pay | Admitting: Nurse Practitioner

## 2015-06-26 NOTE — Telephone Encounter (Signed)
Patient called stating that she has had a bad experience with her rheumatologist and would like a new doctor. Patient states that rheumatologist is telling her that ortho needs to treat her and ortho is telling her that rheumatologist needs to treat her.  Informed patient that Rx is ready for pick up and to keep appt with rheumatology and just tell them how she is feeling.

## 2015-07-01 ENCOUNTER — Ambulatory Visit (INDEPENDENT_AMBULATORY_CARE_PROVIDER_SITE_OTHER): Payer: Medicaid Other | Admitting: Neurology

## 2015-07-01 ENCOUNTER — Encounter: Payer: Self-pay | Admitting: Neurology

## 2015-07-01 VITALS — BP 110/66 | HR 65 | Ht 64.0 in | Wt 175.0 lb

## 2015-07-01 DIAGNOSIS — G43809 Other migraine, not intractable, without status migrainosus: Secondary | ICD-10-CM

## 2015-07-01 DIAGNOSIS — M4692 Unspecified inflammatory spondylopathy, cervical region: Secondary | ICD-10-CM | POA: Diagnosis not present

## 2015-07-01 DIAGNOSIS — M47812 Spondylosis without myelopathy or radiculopathy, cervical region: Secondary | ICD-10-CM

## 2015-07-01 DIAGNOSIS — M797 Fibromyalgia: Secondary | ICD-10-CM

## 2015-07-01 MED ORDER — SUMATRIPTAN SUCCINATE 100 MG PO TABS: ORAL_TABLET | ORAL | Status: DC

## 2015-07-01 NOTE — Progress Notes (Signed)
NEUROLOGY CONSULTATION NOTE  Chelsey [name] MRN: [id number] DOB: 1971-03-01  Referring provider: Kenn File, MD Primary care provider: Chevis Pretty, FNP  Reason for consult:  headache  HISTORY OF PRESENT ILLNESS: Chelsey [name] is a 45 year old right-handed female with fibromyalgia, Sjoegren syndrome, lupus, TMJ dysfunction, anxiety, and hypothyroidism who presents for headache.   She has had multiple types of headaches since 2002, attributed to tension-type, TMJ dysfunction and migraine.  She also has autoimmune disorders including Sjogren's and possible Lupus.  She sees a rheumatologist who told her she has "inflammation" in her neck.  She started having posterior headaches radiating from the neck and traveling to the entire head in November 2016.  It was described as a sharp and squeezing pain.  She also noted burning in back of her neck.  It would be associated with increased photophobia, nausea and vomiting.  It was fairly constant but would fluctuate in intensity, reaching 8/10.  They finally subsided in March after receiving both DepoMedrol shots and Toradol shots.    She sees pain management.  Currently she takes Norco and was taking Valium as well for muscle spasms.  She takes promethazine 50mg  for nausea.  Past NSAIDS:  Advil, Aleve Past analgesics:  Tramadol, Tylenol Past abortive triptans:  none Past muscle relaxants:  various Past anti-emetic:  Zofran 8mg  (increased headache) Past anti-anxiolytic:  Valium recently discontinued. Past sleep aide:  none Past antihypertensive medications:  Metoprolol (for palpitations due to medication) Past antidepressant medications:  Side effects to multiple antidepressants, including amitriptyline, nortriptyline, Cymbalta and SSRIs.  Not sure if she tried Effexor. Past anticonvulsant medications:  Topamax (jittery).  Not sure if she took Depakote Past vitamins/Herbal/Supplements:  Magnesium (side effects)  Caffeine:  10-12  oz coffee daily Alcohol:  no Smoker:  no Diet:  Good.  Hydrates Exercise:  Routine in pool Depression/stress:  stable Sleep hygiene:  Usually good Family history of headache:  No  Over past year, ANA, RF, Sjogren's and anti-smith antibodies have been negative.  Recent B12 and folate normal.  TSH is 0.041 (treated for hypothyroidism)  PAST MEDICAL HISTORY: Past Medical History  Diagnosis Date  . Hypothyroidism   . Anemia   . Allergy   . Fibromyalgia   . Stomach pain   . IBS (irritable bowel syndrome)   . Anxiety   . Asthma   . Arthritis   . Sjogren's disease ([place])   . Cervical cancer ([place])   . Thyroid cancer ([place])     ?  Marland Kitchen Hashimoto's disease   . Depression   . Pneumonia   . Endometriosis   . Lupus ([place])     PAST SURGICAL HISTORY: Past Surgical History  Procedure Laterality Date  . Cyst removed to uterus    . Miscarriage  2  . Ablation    . Thyroidectomy    . Abdominal hysterectomy    . Ovarian cyst surgery      x 2  . Throat surgery      or cyst and tumors    MEDICATIONS: Current Outpatient Prescriptions on File Prior to Visit  Medication Sig Dispense Refill  . albuterol (PROVENTIL HFA;VENTOLIN HFA) 108 (90 BASE) MCG/ACT inhaler Inhale 2 puffs into the lungs every 6 (six) hours as needed for wheezing or shortness of breath. 1 Inhaler 11  . clotrimazole-betamethasone (LOTRISONE) cream Apply 1 application topically 2 (two) times daily. 30 g 1  . cycloSPORINE (RESTASIS) 0.05 % ophthalmic emulsion Place 1 drop into both eyes 2 (  two) times daily.     Marland Kitchen HYDROcodone-acetaminophen (NORCO) 7.5-325 MG tablet Take 1 tablet by mouth every 6 (six) hours as needed for moderate pain. 30 tablet 0  . olopatadine (PATANOL) 0.1 % ophthalmic solution Place 1 drop into both eyes 2 (two) times daily. 5 mL 3  . omeprazole (PRILOSEC) 40 MG capsule Take 1 capsule (40 mg total) by mouth daily. 30 capsule 3  . ondansetron (ZOFRAN) 8 MG tablet TAKE ONE TABLET BY MOUTH EVERY 8 HOURS AS  NEEDED FOR NAUSEA OR VOMITING. 20 tablet 0  . thyroid 48.75 MG TABS Take 48.75 mg by mouth daily before breakfast. 30 tablet 5  . cevimeline (EVOXAC) 30 MG capsule Take 1 capsule (30 mg total) by mouth 3 (three) times daily. For dry mouth (Patient not taking: Reported on 07/01/2015) 90 capsule 5   Current Facility-Administered Medications on File Prior to Visit  Medication Dose Route Frequency Provider Last Rate Last Dose  . levalbuterol (XOPENEX) nebulizer solution 1.25 mg  1.25 mg Nebulization Once Chelsey Penner, FNP   1.25 mg at 04/08/14 1700    ALLERGIES: Allergies  Allergen Reactions  . Augmentin [Amoxicillin-Pot Clavulanate] Diarrhea  . Celebrex [Celecoxib] Hives  . Cymbalta [Duloxetine Hcl]     Extreme nausea and weight loss  . Duloxetine Diarrhea    Extreme nausea and weight loss  . Flexeril [Cyclobenzaprine]     Tremors, anxiety, and eye twitching  . Lyrica [Pregabalin]   . Prednisone Other (See Comments)    hallucinations  . Savella [Milnacipran Hcl]     Caused extreme nausea and weight loss, hallucinations, muscle rigidity   . Plaquenil [Hydroxychloroquine Sulfate] Palpitations    Chest pain    FAMILY HISTORY: Family History  Problem Relation Age of Onset  . Fibromyalgia Mother   . Thyroid nodules Mother   . Alcohol abuse Father   . Hypertension Sister   . Diabetes Brother     SOCIAL HISTORY: Social History   Social History  . Marital Status: Legally Separated    Spouse Name: N/A  . Number of Children: 2  . Years of Education: N/A   Occupational History  . Not on file.   Social History Main Topics  . Smoking status: Never Smoker   . Smokeless tobacco: Never Used  . Alcohol Use: Yes     Comment: occ  . Drug Use: No  . Sexual Activity: Yes    Birth Control/ Protection: Surgical   Other Topics Concern  . Not on file   Social History Narrative    REVIEW OF SYSTEMS: Constitutional: No fevers, chills, or sweats, no generalized fatigue, change  in appetite Eyes: No visual changes, double vision, eye pain Ear, nose and throat: No hearing loss, ear pain, nasal congestion, sore throat Cardiovascular: No chest pain, palpitations Respiratory:  No shortness of breath at rest or with exertion, wheezes GastrointestinaI: nausea Genitourinary:  No dysuria, urinary retention or frequency Musculoskeletal:  Diffuse pain, neck pain Integumentary: No rash, pruritus, skin lesions Neurological: as above Psychiatric: No depression, insomnia, anxiety Endocrine: No palpitations, fatigue, diaphoresis, mood swings, change in appetite, change in weight, increased thirst Hematologic/Lymphatic:  No purpura, petechiae. Allergic/Immunologic: no itchy/runny eyes, nasal congestion, recent allergic reactions, rashes  PHYSICAL EXAM: Filed Vitals:   07/01/15 0955  BP: 110/66  Pulse: 65   General: No acute distress.  Patient appears well-groomed.  Head:  Normocephalic/atraumatic Eyes:  fundi unremarkable, without vessel changes, exudates, hemorrhages or papilledema. Neck: supple, bilateral paraspinal tenderness, reduced range  of motion to left Back: No paraspinal tenderness Heart: regular rate and rhythm Lungs: Clear to auscultation bilaterally. Vascular: No carotid bruits. Neurological Exam: Mental status: alert and oriented to person, place, and time, recent and remote memory intact, fund of knowledge intact, attention and concentration intact, speech fluent and not dysarthric, language intact. Cranial nerves: CN I: not tested CN II: pupils equal, round and reactive to light, visual fields intact, fundi unremarkable, without vessel changes, exudates, hemorrhages or papilledema. CN III, IV, VI:  full range of motion, no nystagmus, no ptosis CN V: facial sensation intact CN VII: upper and lower face symmetric CN VIII: hearing intact CN IX, X: gag intact, uvula midline CN XI: sternocleidomastoid and trapezius muscles intact CN XII: tongue  midline Bulk & Tone: normal, no fasciculations. Motor:  5/5 throughout  Sensation: temperature and vibration sensation intact. Deep Tendon Reflexes:  2+ throughout, toes downgoing.  Finger to nose testing:  Without dysmetria.  Heel to shin:  Without dysmetria.  Gait:  Normal station and stride.  Able to turn and tandem walk. Romberg negative.  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  Since headache broke and she has medication sensitivity, will hold off on starting a preventative.  However, atenolol is an option. 2.  Will try sumatriptan 100mg  for abortive therapy (with promethazine for nausea) 3.  Will get notes from rheumatologist 4.  Treatment for fibromyalgia and arthritis key as per rheumatology and pain management 5.  Follow up in 3 to 4 months.  Thank you for allowing me to take part in the care of this patient.  Metta Clines, DO  CC:  Chelsey Hassell Done, FNP  Kenn File, MD

## 2015-07-01 NOTE — Patient Instructions (Signed)
Migraine Recommendations: 1.  We will hold off on starting a preventative for now 2.  Take sumatriptan 100mg  at earliest onset of headache.  May repeat dose once in 2 hours if needed.  Do not exceed two tablets in 24 hours. 3.  Limit use of pain relievers to no more than 2 days out of the week.  These medications include acetaminophen, ibuprofen, triptans and narcotics.  This will help reduce risk of rebound headaches. 4.  Be aware of common food triggers such as processed sweets, processed foods with nitrites (such as deli meat, hot dogs, sausages), foods with MSG, alcohol (such as wine), chocolate, certain cheeses, certain fruits (dried fruits, some citrus fruit), vinegar, diet soda. 4.  Avoid caffeine 5.  Routine exercise 6.  Proper sleep hygiene 7.  Stay adequately hydrated with water 8.  Keep a headache diary. 9.  Maintain proper stress management. 10.  Do not skip meals. 11.  Consider supplements:  Magnesium oxide 400mg  to 600mg  daily, riboflavin 400mg , Coenzyme Q 10 100mg  three times daily 12.  Follow up in 3 to 4 months.

## 2015-07-06 ENCOUNTER — Other Ambulatory Visit: Payer: Medicaid Other | Admitting: Obstetrics and Gynecology

## 2015-07-06 ENCOUNTER — Telehealth: Payer: Self-pay | Admitting: Nurse Practitioner

## 2015-07-06 NOTE — Telephone Encounter (Signed)
Patient has just left the rheumatologist appointment and they were not willing to treat her for the fibromyalgia and that her ANA is not high enough to treat. Please advise

## 2015-07-07 ENCOUNTER — Ambulatory Visit (INDEPENDENT_AMBULATORY_CARE_PROVIDER_SITE_OTHER): Payer: Medicaid Other | Admitting: Obstetrics and Gynecology

## 2015-07-07 ENCOUNTER — Encounter: Payer: Self-pay | Admitting: Obstetrics and Gynecology

## 2015-07-07 VITALS — BP 112/78 | Ht 63.0 in | Wt 176.5 lb

## 2015-07-07 DIAGNOSIS — Z0000 Encounter for general adult medical examination without abnormal findings: Secondary | ICD-10-CM

## 2015-07-07 DIAGNOSIS — N898 Other specified noninflammatory disorders of vagina: Secondary | ICD-10-CM

## 2015-07-07 DIAGNOSIS — Z01419 Encounter for gynecological examination (general) (routine) without abnormal findings: Secondary | ICD-10-CM

## 2015-07-07 MED ORDER — FLUCONAZOLE 150 MG PO TABS: 150.0000 mg | ORAL_TABLET | Freq: Once | ORAL | Status: DC

## 2015-07-07 NOTE — Progress Notes (Signed)
Patient ID: Chelsey [name], female   DOB: 27-Dec-1970, 45 y.o.   MRN: [id number] Pt here today for annual exam. Pt states that she has some vaginal dryness and ovary pain.

## 2015-07-07 NOTE — Telephone Encounter (Signed)
Patient aware.  Patient states that "pain management, rheumatology will not treat her."  Patient states that "both places do not want to listen to her or help her.  They just want to make her suffer".  Informed patient that she will have to get pain medication from Pain Management.

## 2015-07-07 NOTE — Progress Notes (Signed)
Patient ID: Chelsey [name], female   DOB: Nov 28, 1970, 45 y.o.   MRN: [id number]  Assessment:  Annual Gyn Exam  Vag dryness, possible recurrent yeast Normal gyn exam s/p hyst .    Plan:  1.  2. return tri-annually or prn, to get bse and hemoccult on alternate years thru [facility] 3    Annual mammogram advised 45-55 Subjective:  Chelsey [name] is a 45 y.o. female No obstetric history on file. who presents for annual exam. Patient's last menstrual period was 03/21/2009. she is s/p Abd hyst with removal of cervix.  The patient has complaints today of intermittent ache in pelvis , nonspecific. Not occurring with coitus. Or lifting/straining.  The following portions of the patient's history were reviewed and updated as appropriate: allergies, current medications, past family history, past medical history, past social history, past surgical history and problem list. Past Medical History  Diagnosis Date  . Hypothyroidism   . Anemia   . Allergy   . Fibromyalgia   . Stomach pain   . IBS (irritable bowel syndrome)   . Anxiety   . Asthma   . Arthritis   . Sjogren's disease ([place])   . Cervical cancer ([place])   . Thyroid cancer ([place])     ?  Marland Kitchen Hashimoto's disease   . Depression   . Pneumonia   . Endometriosis   . Lupus [facility])     Past Surgical History  Procedure Laterality Date  . Cyst removed to uterus    . Miscarriage  2  . Ablation    . Thyroidectomy    . Abdominal hysterectomy    . Ovarian cyst surgery      x 2  . Throat surgery      or cyst and tumors     Current outpatient prescriptions:  .  albuterol (PROVENTIL HFA;VENTOLIN HFA) 108 (90 BASE) MCG/ACT inhaler, Inhale 2 puffs into the lungs every 6 (six) hours as needed for wheezing or shortness of breath., Disp: 1 Inhaler, Rfl: 11 .  cevimeline (EVOXAC) 30 MG capsule, Take 1 capsule (30 mg total) by mouth 3 (three) times daily. For dry mouth, Disp: 90 capsule, Rfl: 5 .  clotrimazole-betamethasone (LOTRISONE) cream, Apply 1  application topically 2 (two) times daily., Disp: 30 g, Rfl: 1 .  diclofenac sodium (VOLTAREN) 1 % GEL, Apply topically., Disp: , Rfl:  .  HYDROcodone-acetaminophen (NORCO) 7.5-325 MG tablet, Take 1 tablet by mouth every 6 (six) hours as needed for moderate pain., Disp: 30 tablet, Rfl: 0 .  Lifitegrast (XIIDRA) 5 % SOLN, Apply 1 drop to eye 2 (two) times daily., Disp: , Rfl:  .  olopatadine (PATANOL) 0.1 % ophthalmic solution, Place 1 drop into both eyes 2 (two) times daily., Disp: 5 mL, Rfl: 3 .  omeprazole (PRILOSEC) 40 MG capsule, Take 1 capsule (40 mg total) by mouth daily., Disp: 30 capsule, Rfl: 3 .  thyroid 48.75 MG TABS, Take 48.75 mg by mouth daily before breakfast., Disp: 30 tablet, Rfl: 5 .  ondansetron (ZOFRAN) 8 MG tablet, TAKE ONE TABLET BY MOUTH EVERY 8 HOURS AS NEEDED FOR NAUSEA OR VOMITING. (Patient not taking: Reported on 07/07/2015), Disp: 20 tablet, Rfl: 0 .  promethazine (PHENERGAN) 50 MG tablet, Take 50 mg by mouth., Disp: , Rfl:  .  SUMAtriptan (IMITREX) 100 MG tablet, Take 1tablet at earliest onset of headache.  May repeat once in 2 hours if headache persists or recurs.  Do not exceed 2 tablets in 24 hours (Patient not  taking: Reported on 07/07/2015), Disp: 10 tablet, Rfl: 2  Current facility-administered medications:  .  levalbuterol (XOPENEX) nebulizer solution 1.25 mg, 1.25 mg, Nebulization, Once, Lysbeth Penner, FNP, 1.25 mg at 04/08/14 1700  Review of Systems Constitutional: negative Gastrointestinal: negative Genitourinary: no sui UI. bm regular. + vag dryness. She had full w/u for breast d/c bilaterally without finding of abnormality.  Objective:  BP 112/78 mmHg  Ht 5\' 3"  (1.6 m)  Wt 176 lb 8 oz (80.06 kg)  BMI 31.27 kg/m2  LMP 03/21/2009   BMI: Body mass index is 31.27 kg/(m^2).  General Appearance: Alert, appropriate appearance for age. No acute distress HEENT: Grossly normal Neck / Thyroid:  Cardiovascular: RRR; normal S1, S2, no murmur Lungs: CTA  bilaterally Back: No CVAT Breast Exam: No dimpling, nipple retraction or discharge. No masses or nodes., Normal to inspection and No masses or nodes.No dimpling, nipple retraction or discharge. Gastrointestinal: Soft, non-tender, no masses or organomegaly Pelvic Exam: Vaginal: normal mucosa without prolapse or lesions, normal without tenderness, induration or masses and normal rugae Cervix: normal appearance Adnexa: normal bimanual exam Uterus: absent Rectal: good sphincter tone, no masses and guaiac negative Exam limited by none Clinical staff offered to be present for exam: yes  Initials: AMT Rectovaginal: normal rectal, no masses Lymphatic Exam: Non-palpable nodes in neck, clavicular, axillary, or inguinal regions Skin: no rash or abnormalities Neurologic: Normal gait and speech, no tremor  Psychiatric: Alert and oriented, appropriate affect.  Urinalysis:Not done  Mallory Shirk. MD Pgr [phone] 3:20 PM

## 2015-07-07 NOTE — Telephone Encounter (Signed)
Her only option may be pain management

## 2015-07-10 ENCOUNTER — Other Ambulatory Visit: Payer: Self-pay | Admitting: Nurse Practitioner

## 2015-07-13 NOTE — Telephone Encounter (Signed)
Last seen 06/09/15  Dr Wendi Snipes MMM PCP  If approved route to nurse to call into [facility]  [phone]

## 2015-07-13 NOTE — Telephone Encounter (Signed)
Please call in diazepam with 0 refills 

## 2015-07-13 NOTE — Telephone Encounter (Signed)
Called to [facility] 

## 2015-07-14 ENCOUNTER — Ambulatory Visit (INDEPENDENT_AMBULATORY_CARE_PROVIDER_SITE_OTHER): Payer: Medicaid Other | Admitting: Family Medicine

## 2015-07-14 ENCOUNTER — Encounter: Payer: Self-pay | Admitting: Family Medicine

## 2015-07-14 VITALS — BP 123/84 | HR 74 | Temp 97.7°F | Ht 63.0 in | Wt 179.8 lb

## 2015-07-14 DIAGNOSIS — F419 Anxiety disorder, unspecified: Secondary | ICD-10-CM

## 2015-07-14 DIAGNOSIS — M5030 Other cervical disc degeneration, unspecified cervical region: Secondary | ICD-10-CM

## 2015-07-14 DIAGNOSIS — M25559 Pain in unspecified hip: Secondary | ICD-10-CM

## 2015-07-14 NOTE — Patient Instructions (Signed)
Great to see you!  Call [phone] for [facility] in [place]  We will work on an appt with Dr. Ron Agee

## 2015-07-14 NOTE — Progress Notes (Signed)
   HPI  Patient presents today here to request referrals.  Patient explains that she's had much increased anxiety since starting Norco for her pain. She would like to see a psychiatrist and ask for advice. Suicidal thoughts.  Neck pain Now on Norco from pain management. She explains that she would like to see her orthopedist for epidural injections and requests a referral back to see them. Her pain is helped by [facility], however she would not like to change providers for epidural injections.  He feels like from her neck perspective she is doing well.  Hip and back pain She states that she would like to see Dr. Ron Agee again for hip and lumbar back pain. She requests referral.  Mood Increased anxiety lately, has been trying to reduce Valium due to other sedating medications. Reports recent soma use Would like to see a psychiatrist No suicidal ideation, is having depression and anxiety   PMH: Smoking status noted ROS: Per HPI  Objective: BP 123/84 mmHg  Pulse 74  Temp(Src) 97.7 F (36.5 C) (Oral)  Ht 5\' 3"  (1.6 m)  Wt 179 lb 12.8 oz (81.557 kg)  BMI 31.86 kg/m2  LMP 03/21/2009 Gen: NAD, alert, cooperative with exam HEENT: NCAT CV: RRR, good S1/S2, no murmur Resp: CTABL, no wheezes, non-labored Ext: No edema, warm Neuro: Alert and oriented, No gross deficits  Assessment and plan:  # Hip and back pain Long-standing, refer to orthopedic surgery, she has an established relationship with Dr. Ron Agee already  # Anxiety Referred to psychiatry, patient has phone number to call  # Cervical DDD Discussed avoiding NSAIDs with complaints of gastritis after using etodolac (no melena, hematochezia, previously evaluated with EGD and Cscope which were normal) Continue PPI, continue narcotics per pain management    Orders Placed This Encounter  Procedures  . Ambulatory referral to Orthopedic Surgery    Referral Priority:  Routine    Referral Type:  Surgical    Referral Reason:   Specialty Services Required    Requested Specialty:  Orthopedic Surgery    Number of Visits Requested:  1  . Ambulatory referral to Psychiatry    Referral Priority:  Routine    Referral Type:  Psychiatric    Referral Reason:  Specialty Services Required    Requested Specialty:  Psychiatry    Number of Visits Requested:  1    No orders of the defined types were placed in this encounter.    Laroy Apple, MD [facility] 07/14/2015, 11:22 AM

## 2015-07-30 ENCOUNTER — Telehealth: Payer: Self-pay | Admitting: Neurology

## 2015-07-30 NOTE — Telephone Encounter (Signed)
Chelsey [name] 20-Apr-2070. She was wanting to let you know that she had a bad reaction to the medication Sumstriptam for her headaches. She had sweating and roaring in her ears. She has stopped taking the medication. She didn't know if there was another medication she could try instead. Her number is [phone]. Thank you

## 2015-07-30 NOTE — Telephone Encounter (Signed)
Please see message below. Pt is reporting reaction to sumatriptan, and would like to know if there is another medication she can try for her migraines. Please advise.

## 2015-07-31 ENCOUNTER — Telehealth: Payer: Self-pay | Admitting: Neurology

## 2015-07-31 NOTE — Telephone Encounter (Signed)
We can try an alternative triptan.  Maxalt 10mg .  Take 1 tablet at earliest onset of headache.  May repeat dose once in 2 hours if needed (do not exceed 2 tablets in 24 hours).

## 2015-07-31 NOTE — Telephone Encounter (Signed)
Message sent via mychart. Will call in later morning hours if pt has not replied.

## 2015-07-31 NOTE — Telephone Encounter (Signed)
Called pt, female answered stated pt was sleeping. Told female to have pt return my call, or to reply to my mychart message. Verbalized understanding.

## 2015-07-31 NOTE — Telephone Encounter (Signed)
Left vm for pt to return call. Will close encounter until pt has contacted [place].

## 2015-07-31 NOTE — Telephone Encounter (Signed)
Leanah Hiester called back and said that she would try that new medication. She uses [facility] in [place].  Thank you

## 2015-07-31 NOTE — Telephone Encounter (Signed)
Pt states that she is Serotonin sensitive, and cannot take a lot of medication. Is there anything else she can try? Please advise.

## 2015-08-03 NOTE — Telephone Encounter (Signed)
Pt states she will wait until OV to discuss.

## 2015-08-03 NOTE — Telephone Encounter (Signed)
All triptans (which is the class of medication that specifically treats migraine) are vascular serotonin agonists.  She has already tried Tylenol and tramadol.  The only options left would be Excedrin Migraine or an NSAID.  We can prescribe her naproxen 550mg , which can be taken every 12 hours as needed (limited to no more than 2 days out of the week).

## 2015-08-13 ENCOUNTER — Encounter: Payer: Self-pay | Admitting: Family Medicine

## 2015-08-13 ENCOUNTER — Other Ambulatory Visit: Payer: Self-pay | Admitting: Nurse Practitioner

## 2015-08-14 NOTE — Telephone Encounter (Signed)
Last filled 07/14/15

## 2015-08-14 NOTE — Telephone Encounter (Signed)
Please call in daizepam with 0 refills

## 2015-08-26 ENCOUNTER — Ambulatory Visit: Payer: Medicaid Other | Admitting: Neurology

## 2015-08-31 ENCOUNTER — Encounter: Payer: Self-pay | Admitting: Neurology

## 2015-08-31 ENCOUNTER — Ambulatory Visit (INDEPENDENT_AMBULATORY_CARE_PROVIDER_SITE_OTHER): Payer: Medicaid Other | Admitting: Neurology

## 2015-08-31 VITALS — BP 124/76 | HR 95 | Ht 63.0 in | Wt 181.0 lb

## 2015-08-31 DIAGNOSIS — G43709 Chronic migraine without aura, not intractable, without status migrainosus: Secondary | ICD-10-CM

## 2015-08-31 NOTE — Progress Notes (Signed)
NEUROLOGY FOLLOW UP OFFICE NOTE  KRISELLE HEAVILIN FU:[id number]  HISTORY OF PRESENT ILLNESS: Chelsey [name] is a 45 year old right-handed female with fibromyalgia, Sjogren syndrome, lupus, TMJ dysfunction, anxiety, and hypothyroidism who follows up for cervicogenic migraines.  UPDATE: She had side effect to sumatriptan.  She doesn't want to try another triptan.  She went to pain management and oxycodone was increased.  Neck pain has improved but she still has over 15 headache days a month.   HISTORY: She has had multiple types of headaches since 2002, attributed to tension-type, TMJ dysfunction and migraine.  She also has autoimmune disorders including Sjogren's and possible Lupus.  She sees a rheumatologist who told her she has "inflammation" in her neck.  She started having posterior headaches radiating from the neck and traveling to the entire head in November 2016.  It was described as a sharp and squeezing pain.  She also noted burning in back of her neck.  It would be associated with increased photophobia, nausea and vomiting.  It was fairly constant but would fluctuate in intensity, reaching 8/10.  They finally subsided in March after receiving both DepoMedrol shots and Toradol shots.    She sees pain management.  Currently she takes Norco and was taking Valium as well for muscle spasms.  She takes promethazine 50mg  for nausea.  Past NSAIDS:  Advil, Aleve Past analgesics:  Tramadol, Tylenol Past abortive triptans:  none Past muscle relaxants:  various Past anti-emetic:  Zofran 8mg  (increased headache) Past anti-anxiolytic:  Valium recently discontinued. Past sleep aide:  none Past antihypertensive medications:  Metoprolol (for palpitations due to medication) Past antidepressant medications:  Side effects to multiple antidepressants, including amitriptyline, nortriptyline, Cymbalta and SSRIs.  Not sure if she tried Effexor. Past anticonvulsant medications:  Topamax (jittery).  Not sure  if she took Depakote Past vitamins/Herbal/Supplements:  Magnesium (side effects)  Caffeine:  10-12 oz coffee daily Alcohol:  no Smoker:  no Diet:  Good.  Hydrates Exercise:  Routine in pool Depression/stress:  stable Sleep hygiene:  Usually good Family history of headache:  No  PAST MEDICAL HISTORY: Past Medical History  Diagnosis Date  . Hypothyroidism   . Anemia   . Allergy   . Fibromyalgia   . Stomach pain   . IBS (irritable bowel syndrome)   . Anxiety   . Asthma   . Arthritis   . Sjogren's disease ([place])   . Cervical cancer ([place])   . Thyroid cancer ([place])     ?  Marland Kitchen Hashimoto's disease   . Depression   . Pneumonia   . Endometriosis   . Lupus [facility])     MEDICATIONS: Current Outpatient Prescriptions on File Prior to Visit  Medication Sig Dispense Refill  . albuterol (PROVENTIL HFA;VENTOLIN HFA) 108 (90 BASE) MCG/ACT inhaler Inhale 2 puffs into the lungs every 6 (six) hours as needed for wheezing or shortness of breath. 1 Inhaler 11  . cevimeline (EVOXAC) 30 MG capsule Take 1 capsule (30 mg total) by mouth 3 (three) times daily. For dry mouth 90 capsule 5  . clotrimazole-betamethasone (LOTRISONE) cream Apply 1 application topically 2 (two) times daily. 30 g 1  . diazepam (VALIUM) 5 MG tablet TAKE ONE TABLET BY MOUTH EVERY 12 HOURS AS NEEDED FOR ANXIETY. 60 tablet 0  . diclofenac sodium (VOLTAREN) 1 % GEL Apply topically.    Marland Kitchen Lifitegrast (XIIDRA) 5 % SOLN Apply 1 drop to eye 2 (two) times daily.    Marland Kitchen olopatadine (PATANOL) 0.1 %  ophthalmic solution Place 1 drop into both eyes 2 (two) times daily. 5 mL 3  . omeprazole (PRILOSEC) 40 MG capsule TAKE ONE CAPSULE BY MOUTH DAILY. 30 capsule 0  . promethazine (PHENERGAN) 50 MG tablet Take 50 mg by mouth.    . thyroid 48.75 MG TABS Take 48.75 mg by mouth daily before breakfast. 30 tablet 5   Current Facility-Administered Medications on File Prior to Visit  Medication Dose Route Frequency Provider Last Rate Last Dose  .  levalbuterol (XOPENEX) nebulizer solution 1.25 mg  1.25 mg Nebulization Once Lysbeth Penner, FNP   1.25 mg at 04/08/14 1700    ALLERGIES: Allergies  Allergen Reactions  . Celebrex [Celecoxib] Hives, Rash and Anaphylaxis  . Augmentin [Amoxicillin-Pot Clavulanate] Diarrhea  . Cymbalta [Duloxetine Hcl]     Extreme nausea and weight loss  . Duloxetine Diarrhea    Other reaction(s): GI Upset (intolerance) Extreme nausea and weight loss Extreme nausea and weight loss Extreme nausea and weight loss  . Flexeril [Cyclobenzaprine]     Tremors, anxiety, and eye twitching Tremors, anxiety, and eye twitching  . Gabapentin     Nausea, insomnia  . Lyrica [Pregabalin]     Shaking, tremors  . Meloxicam     Nausea, insomnia  . Prednisone Other (See Comments)    Other reaction(s): Other (See Comments) hallucinations Other reaction(s): Hallucinations Sucidal Other reaction(s): Mental Status Changes (intolerance) hallucinations  . Savella [Milnacipran Hcl]     Caused extreme nausea and weight loss, hallucinations, muscle rigidity   . Sumatriptan   . Milnacipran Anxiety    Caused extreme nausea and weight loss, hallucinations, muscle rigidity  . Plaquenil [Hydroxychloroquine Sulfate] Palpitations    Chest pain    FAMILY HISTORY: Family History  Problem Relation Age of Onset  . Fibromyalgia Mother   . Thyroid nodules Mother   . Alcohol abuse Father   . Hypertension Sister   . Diabetes Brother     SOCIAL HISTORY: Social History   Social History  . Marital Status: Legally Separated    Spouse Name: N/A  . Number of Children: 2  . Years of Education: N/A   Occupational History  . Not on file.   Social History Main Topics  . Smoking status: Never Smoker   . Smokeless tobacco: Never Used  . Alcohol Use: Yes     Comment: occ  . Drug Use: No  . Sexual Activity: Yes    Birth Control/ Protection: Surgical   Other Topics Concern  . Not on file   Social History Narrative     REVIEW OF SYSTEMS: Constitutional: No fevers, chills, or sweats, no generalized fatigue, change in appetite Eyes: No visual changes, double vision, eye pain Ear, nose and throat: No hearing loss, ear pain, nasal congestion, sore throat Cardiovascular: No chest pain, palpitations Respiratory:  No shortness of breath at rest or with exertion, wheezes GastrointestinaI: No nausea, vomiting, diarrhea, abdominal pain, fecal incontinence Genitourinary:  No dysuria, urinary retention or frequency Musculoskeletal:  Neck pain, hip pain Integumentary: No rash, pruritus, skin lesions Neurological: as above Psychiatric: No depression, insomnia, anxiety Endocrine: No palpitations, fatigue, diaphoresis, mood swings, change in appetite, change in weight, increased thirst Hematologic/Lymphatic:  No purpura, petechiae. Allergic/Immunologic: no itchy/runny eyes, nasal congestion, recent allergic reactions, rashes  PHYSICAL EXAM: Filed Vitals:   08/31/15 0931  BP: 124/76  Pulse: 95   General: No acute distress.  Patient appears well-groomed. Head:  Normocephalic/atraumatic Eyes:  Fundi examined but not visualized Neck: supple, bilateral  paraspinal tenderness, full range of motion Heart:  Regular rate and rhythm Lungs:  Clear to auscultation bilaterally Back: No paraspinal tenderness Neurological Exam: alert and oriented to person, place, and time. Attention span and concentration intact, recent and remote memory intact, fund of knowledge intact.  Speech fluent and not dysarthric, language intact.  CN II-XII intact. Bulk and tone normal, muscle strength 5/5 throughout.  Sensation to light touch  intact.  Deep tendon reflexes 2+ throughout, toes downgoing.  Finger to nose and heel to shin testing intact.  Gait normal.  IMPRESSION: Chronic migraine.  She has longstanding history of over 15 headache days per month.  She has history of medication sensitivity.  She has either failed or had side effects  to multiple preventative and abortive medication.  I think she would benefit from Botox.  PLAN: Will set up for Botox.  26 minutes spent face to face with patient, over 50% spent discussing management.  Metta Clines, DO  CC:  Mary-Margaret Hassell Done, FNP

## 2015-08-31 NOTE — Patient Instructions (Signed)
1.  We will try to get pre-authorization for botox.  I want to see you for first round.

## 2015-09-01 ENCOUNTER — Telehealth: Payer: Self-pay

## 2015-09-01 ENCOUNTER — Other Ambulatory Visit: Payer: Self-pay | Admitting: Family Medicine

## 2015-09-01 DIAGNOSIS — M329 Systemic lupus erythematosus, unspecified: Secondary | ICD-10-CM

## 2015-09-01 DIAGNOSIS — M3500 Sicca syndrome, unspecified: Secondary | ICD-10-CM

## 2015-09-01 NOTE — Telephone Encounter (Signed)
Patient wants a referral to another Rheumatologist   She has a name of one

## 2015-09-01 NOTE — Telephone Encounter (Signed)
COvering while PCP is away  Referral to rheum placed. Pt has Sjogrens and SLE.   Laroy Apple, MD [facility] 09/01/2015, 12:58 PM

## 2015-09-02 ENCOUNTER — Telehealth: Payer: Self-pay

## 2015-09-02 NOTE — Telephone Encounter (Signed)
I am treating only her migraines.  The Botox is specifically to treat her migraines, not her chronic pain.  I believe her pain specialist is only treating her other chronic pain.  It should not be an issue.

## 2015-09-02 NOTE — Telephone Encounter (Signed)
Patient of [facility]MM. Please advise 

## 2015-09-02 NOTE — Telephone Encounter (Signed)
Patient called. States that she needs to cancel Botox appointment. Pt states she went to see her pain specialist today and mentioned the Botox, and provider got very upset and is now threatening to dismiss her for reach of contract for seeking treatment outside of their practice. Pt sees Dr. Gillis Santa @  [facility] Phone: [phone], Fax: [phone], [id number], [place], [place] [address].   Pt states that her orthopeadic provider (Dr. Lynann Bologna [facility] [address] [place], [place] [address] Phone: [phone] Fax: [phone]) had thought Botox might be best course of action as well. Pt was going to contact Dr. Laurena Bering office as well.   Pt was requesting provider write letter to Dr. Earley Favor. I however sent most recent OV note to said provider. Pt was aware. Will call back if Dr. Earley Favor needs further info.

## 2015-09-07 NOTE — Telephone Encounter (Signed)
Opened in error

## 2015-09-16 ENCOUNTER — Encounter: Payer: Self-pay | Admitting: Neurology

## 2015-09-18 ENCOUNTER — Telehealth: Payer: Self-pay | Admitting: Nurse Practitioner

## 2015-09-18 MED ORDER — MONTELUKAST SODIUM 10 MG PO TABS: 10.0000 mg | ORAL_TABLET | Freq: Every day | ORAL | Status: AC

## 2015-09-18 NOTE — Telephone Encounter (Signed)
singulair rx sent to pharmacy

## 2015-09-21 ENCOUNTER — Ambulatory Visit (HOSPITAL_COMMUNITY): Payer: Self-pay | Admitting: Psychiatry

## 2015-09-21 ENCOUNTER — Telehealth (HOSPITAL_COMMUNITY): Payer: Self-pay | Admitting: *Deleted

## 2015-09-21 NOTE — Telephone Encounter (Signed)
Opened in Error.

## 2015-09-24 ENCOUNTER — Ambulatory Visit (HOSPITAL_COMMUNITY): Payer: Self-pay | Admitting: Psychiatry

## 2015-09-25 ENCOUNTER — Other Ambulatory Visit: Payer: Self-pay | Admitting: Nurse Practitioner

## 2015-09-28 NOTE — Telephone Encounter (Signed)
Please call in diazepam with 0 refills 

## 2015-09-28 NOTE — Telephone Encounter (Signed)
Last seen 07/14/15   Dr Wendi Snipes  [facility] PCP  If approved route to nurse to call into [place]  [phone]

## 2015-10-15 ENCOUNTER — Telehealth: Payer: Self-pay | Admitting: Nurse Practitioner

## 2015-10-23 ENCOUNTER — Ambulatory Visit: Payer: Medicaid Other | Admitting: Neurology

## 2015-10-30 ENCOUNTER — Telehealth: Payer: Self-pay | Admitting: Nurse Practitioner

## 2015-11-02 ENCOUNTER — Other Ambulatory Visit: Payer: Self-pay | Admitting: Nurse Practitioner

## 2015-11-02 DIAGNOSIS — M545 Low back pain: Secondary | ICD-10-CM

## 2015-11-02 NOTE — Telephone Encounter (Signed)
I need to know for hip pain or what

## 2015-11-02 NOTE — Telephone Encounter (Signed)
Patient states she needs referral for lower back pain and bilateral hip pain. Patient also states it is for a follow up for her injections.

## 2015-11-05 ENCOUNTER — Other Ambulatory Visit: Payer: Self-pay | Admitting: Nurse Practitioner

## 2015-11-11 ENCOUNTER — Ambulatory Visit (HOSPITAL_COMMUNITY): Payer: Self-pay | Admitting: Psychiatry

## 2015-11-12 ENCOUNTER — Telehealth: Payer: Self-pay | Admitting: Nurse Practitioner

## 2015-11-22 ENCOUNTER — Other Ambulatory Visit: Payer: Self-pay | Admitting: Obstetrics and Gynecology

## 2015-11-24 ENCOUNTER — Ambulatory Visit: Payer: Self-pay | Admitting: Neurology

## 2015-11-25 ENCOUNTER — Ambulatory Visit (HOSPITAL_COMMUNITY): Payer: Self-pay | Admitting: Psychiatry

## 2015-11-25 ENCOUNTER — Encounter (HOSPITAL_COMMUNITY): Payer: Self-pay

## 2015-11-27 ENCOUNTER — Ambulatory Visit: Payer: Self-pay | Admitting: Neurology

## 2015-11-30 ENCOUNTER — Other Ambulatory Visit: Payer: Self-pay | Admitting: Nurse Practitioner

## 2015-12-01 NOTE — Telephone Encounter (Signed)
Please call in valium with 0 refills

## 2015-12-10 ENCOUNTER — Other Ambulatory Visit: Payer: Self-pay | Admitting: Obstetrics and Gynecology

## 2015-12-28 ENCOUNTER — Ambulatory Visit (INDEPENDENT_AMBULATORY_CARE_PROVIDER_SITE_OTHER): Payer: Medicaid Other | Admitting: Neurology

## 2015-12-28 DIAGNOSIS — G43709 Chronic migraine without aura, not intractable, without status migrainosus: Secondary | ICD-10-CM | POA: Diagnosis not present

## 2015-12-28 MED ORDER — ONABOTULINUMTOXINA 100 UNITS IJ SOLR
155.0000 [IU] | Freq: Once | INTRAMUSCULAR | Status: AC
  Administered 2015-12-28: 155 [IU] via INTRAMUSCULAR

## 2015-12-28 NOTE — Progress Notes (Signed)
See procedure note.

## 2015-12-28 NOTE — Procedures (Signed)
[facility]   Procedure Note Botox  Attending: Dr. Jaffe  Preoperative Diagnosis(es): Chronic migraine  Consent obtained from: The patient Benefits discussed included, but were not limited to decreased muscle tightness, increased joint range of motion, and decreased pain.  Risk discussed included, but were not limited pain and discomfort, bleeding, bruising, excessive weakness, venous thrombosis, muscle atrophy and dysphagia.  Anticipated outcomes of the procedure as well as he risks and benefits of the alternatives to the procedure, and the roles and tasks of the personnel to be involved, were discussed with the patient, and the patient consents to the procedure and agrees to proceed. A copy of the patient medication guide was given to the patient which explains the blackbox warning.  Patients identity and treatment sites confirmed Yes.  .  Details of Procedure: Skin was cleaned with alcohol. Prior to injection, the needle plunger was aspirated to make sure the needle was not within a blood vessel.  There was no blood retrieved on aspiration.    Following is a summary of the muscles injected  And the amount of Botulinum toxin used:  Dilution 200 units of Botox was reconstituted with 4 ml of preservative free normal saline. Time of reconstitution: At the time of the office visit (<30 minutes prior to injection)   Injections  155 total units of Botox was injected with a 30 gauge needle.  Injection Sites: L occipitalis: 15 units- 3 sites  R occiptalis: 15 units- 3 sites  L upper trapezius: 15 units- 3 sites R upper trapezius: 15 units- 3 sits          L paraspinal: 10 units- 2 sites R paraspinal: 10 units- 2 sites  Face L frontalis(2 injection sites):10 units   R frontalis(2 injection sites):10 units         L corrugator: 5 units   R corrugator: 5 units           Procerus: 5 units   L temporalis: 20 units R temporalis: 20 units   Agent:  200 units of botulinum Type A  (Onobotulinum Toxin type A) was reconstituted with 4 ml of preservative free normal saline.  Time of reconstitution: At the time of the office visit (<30 minutes prior to injection)     Total injected (Units): 155  Total wasted (Units): 45  Patient tolerated procedure well without complications.   Reinjection is anticipated in 3 months.   

## 2015-12-30 ENCOUNTER — Other Ambulatory Visit: Payer: Self-pay | Admitting: Nurse Practitioner

## 2015-12-31 ENCOUNTER — Telehealth: Payer: Self-pay | Admitting: *Deleted

## 2015-12-31 NOTE — Telephone Encounter (Signed)
Informed patient diazepam would not be filled .Patient thinks she would like to have a visit with Dr. Wendi Snipes.  She also stated ,he may be the one that has helped her a lot.  She was told that there were no encounters showing that she had a visit with this doctor.   She says she wanted an appointment any way.

## 2016-01-05 ENCOUNTER — Encounter: Payer: Self-pay | Admitting: Family Medicine

## 2016-01-05 ENCOUNTER — Ambulatory Visit (INDEPENDENT_AMBULATORY_CARE_PROVIDER_SITE_OTHER): Payer: Medicaid Other | Admitting: Family Medicine

## 2016-01-05 VITALS — BP 116/86 | HR 75 | Temp 97.8°F | Ht 63.0 in | Wt 180.2 lb

## 2016-01-05 DIAGNOSIS — Z23 Encounter for immunization: Secondary | ICD-10-CM | POA: Diagnosis not present

## 2016-01-05 DIAGNOSIS — F419 Anxiety disorder, unspecified: Secondary | ICD-10-CM

## 2016-01-05 MED ORDER — DIAZEPAM 5 MG PO TABS: 5.0000 mg | ORAL_TABLET | Freq: Two times a day (BID) | ORAL | 2 refills | Status: DC | PRN

## 2016-01-05 NOTE — Progress Notes (Signed)
   HPI  Patient presents today here to follow-up for anxiety.  Patient isn't taking Valium twice daily on most days. She is managed by pain management for chronic pain with oxycodone. She is also receiving epidural injections.   She would like to see a psychiatrist.   She denies any suicidal thoughts and has not been able to tolerate  She has complaint of central chest pain described as tightness on the surface of the skin. She has an outlined area in the central part of her chest and states that when she touches this area a gets worse when the pain is present. It seems to be wheezing. It happens only at night with lying down.  PMH: Smoking status noted ROS: Per HPI  Objective: BP 116/86   Pulse 75   Temp 97.8 F (36.6 C) (Oral)   Ht 5\' 3"  (1.6 m)   Wt 180 lb 3.2 oz (81.7 kg)   LMP 03/21/2009   BMI 31.92 kg/m  Gen: NAD, alert, cooperative with exam HEENT: NCAT, EOMI, PERRL CV: RRR, good S1/S2, no murmur Chest wall - no tenderness to palpation Resp: CTABL, no wheezes, non-labored Ext: No edema, warm Neuro: Alert and oriented, No gross deficits  Assessment and plan:  # Anxiety Refilled Valium X 3 months, needs appt for refill Cannot tolerate any controller meds as of yet, needs psych appt.  Referral written and list given.      Orders Placed This Encounter  Procedures  . Ambulatory referral to Psychiatry    Referral Priority:   Routine    Referral Type:   Psychiatric    Referral Reason:   Specialty Services Required    Requested Specialty:   Psychiatry    Number of Visits Requested:   1    Meds ordered this encounter  Medications  . diazepam (VALIUM) 5 MG tablet    Sig: Take 1 tablet (5 mg total) by mouth every 12 (twelve) hours as needed. for anxiety    Dispense:  60 tablet    Refill:  [place], MD [facility] 01/05/2016, 2:27 PM

## 2016-02-02 ENCOUNTER — Telehealth: Payer: Self-pay

## 2016-02-02 NOTE — Telephone Encounter (Signed)
Pt called requesting MRI brain. Stated her pain management doctor felt she needed it cause they are unable to control her pain. Advised they should order MRI if they felt it was needed. Pt got angry and began cursing. Advised I was going to end conversation, as I would not continue to be yelled at and belittled, but that I would forward message to provider. Please advise.

## 2016-02-02 NOTE — Telephone Encounter (Signed)
From my standpoint , I see no indication for me to order an MRI. If her pain doctor, who is a physician, believes she needs an MRI, then he/she should order it.

## 2016-02-03 NOTE — Telephone Encounter (Signed)
Relayed message to pt. Requested an appointment because she stated she recently reported to hospital for 3 hour headache that afterwards left her with some cognitive deficits for a little while. Pt was evaluated and told she did not have a stroke. Pt wanted to discuss with you, "so the story gets to him straight". Pt was scheduled for next available which was in February. Pt stated that she "hoped provider would take her ailment as serious as she does, as other obviously are not"

## 2016-02-03 NOTE — Telephone Encounter (Signed)
Called and spoke with [facility] @ [place] Tel: [phone]. They did not recommend MRI be preformed. Nurse stated, "if we did, we would get it ourselves, anyway"

## 2016-02-04 ENCOUNTER — Encounter: Payer: Self-pay | Admitting: Family Medicine

## 2016-02-04 ENCOUNTER — Ambulatory Visit (INDEPENDENT_AMBULATORY_CARE_PROVIDER_SITE_OTHER): Payer: Medicaid Other | Admitting: Family Medicine

## 2016-02-04 VITALS — BP 123/78 | HR 85 | Temp 96.9°F | Ht 63.0 in | Wt 180.0 lb

## 2016-02-04 DIAGNOSIS — G894 Chronic pain syndrome: Secondary | ICD-10-CM | POA: Diagnosis not present

## 2016-02-04 DIAGNOSIS — F419 Anxiety disorder, unspecified: Secondary | ICD-10-CM | POA: Diagnosis not present

## 2016-02-04 DIAGNOSIS — R51 Headache: Secondary | ICD-10-CM

## 2016-02-04 DIAGNOSIS — G8929 Other chronic pain: Secondary | ICD-10-CM

## 2016-02-04 NOTE — Progress Notes (Signed)
   HPI  Patient presents today for emergency room follow-up for headaches.  Patient was in the emergency room for acute on chronic headache. She was treated with Fioricet and Toradol injection and had improvement.  She states that she has been recommended to have an MRI by her pain management specialist. She would like to consider seeing another neurologist, perhaps a specific headache clinic. She is very comfortable going towards [facility] would like a different referral today, she like to consider a second opinion. She had no improvement from Botox injections.  Patient states and during her episode she had difficulty talking, confusion, stuttering, and could not remember her son's name. She has had complete resolution of all the symptoms since that time.  PMH: Smoking status noted ROS: Per HPI  Objective: BP 123/78   Pulse 85   Temp (!) 96.9 F (36.1 C) (Oral)   Ht 5\' 3"  (1.6 m)   Wt 180 lb (81.6 kg)   LMP 03/21/2009   BMI 31.89 kg/m  Gen: NAD, alert, cooperative with exam HEENT: NCAT, EOMI, PERRLA CV: RRR, good S1/S2, no murmur Resp: CTABL, no wheezes, non-labored Ext: No edema, warm Neuro: Alert and oriented, strength 4/5 and symmetric in bilateral grip, strength 5/5 and symmetric in bilateral lower extremities, sensation intact in all 4 extremities, cranial nerves II through XII intact  Assessment and plan:  # Chronic headache Difficult headache situation Patient had good improvement from Fioricet, recommended discussing this with pain management as it is another controlled substance. I have sent her referral for another neurologist/headache clinic in [facility], she's more comfortable traveling to the cities, and she would like a second opinion. She states that she is fully willing also to see her previous neurologist again as she does have hope that another round of Botox injections may be helpful.  #Anxiety Stable Patient still has not made an  appointment with psychiatry, she states that she lost her list of psychiatrists. Symptoms are stable I have included the list of her of psychiatrists in her AVS  Chronic pain syndrome Stable Patient is very happy with her pain clinic Pain mngmnt managing meds.  Would avoid adding fiorcet for this reason, already on 2 controlled substances.   Orders Placed This Encounter  Procedures  . AMB referral to headache clinic    Referral Priority:   Routine    Referral Type:   Consultation    Number of Visits Requested:   [place], MD [facility] 02/04/2016, 10:53 AM

## 2016-02-04 NOTE — Patient Instructions (Signed)
Great to see you!  We will work on a referral to headache clinic     Your provider wants you to schedule an appointment with a Psychologist/Psychiatrist. The following list of offices requires the patient to call and make their own appointment, as there is information they need that only you can provide. Please feel free to choose form the following providers:  [facility] in [place]  [place] [facility]  [phone] [place], [place]  (Scheduled through [facility]) Must call and do an interview for appointment. Sees Children / Accepts Medicaid  [facility] [place]  [address], [place]    [place], [place]  [phone] [place], [place] for Autism but does not treat it Sees Children / Accepts Medicaid  [facility]    [id number] [address], [place], [place] Medication management, substance abuse, bipolar, grief, family, marriage, OCD, anxiety, PTSD Sees children / Accepts Medicaid  [facility]    [phone] [address] [place], [place] children / Accepts [facility]  [facility]  [phone] [address] [place], [place]   Dr Lorenza Evangelist     [phone] [address], [place], [place]  Sees ADD & ADHD for treatment Accepts Medicaid  [place] [facility]  [phone] Arlean Hopping, [place] for Autism Accepts [facility]  [facility]   [phone] [place], [place]  Does Adult ADD evaluations Does not accept Medicaid  [facility]   [phone] [place], [place] therapy  Sees children as young as 10 years old Accepts Medicaid

## 2016-03-07 ENCOUNTER — Other Ambulatory Visit: Payer: Self-pay | Admitting: Nurse Practitioner

## 2016-03-22 ENCOUNTER — Ambulatory Visit: Payer: Self-pay | Admitting: Neurology

## 2016-03-23 ENCOUNTER — Other Ambulatory Visit: Payer: Self-pay | Admitting: Family Medicine

## 2016-03-24 ENCOUNTER — Other Ambulatory Visit: Payer: Self-pay | Admitting: Family Medicine

## 2016-03-30 ENCOUNTER — Other Ambulatory Visit: Payer: Self-pay | Admitting: Family Medicine

## 2016-04-14 ENCOUNTER — Telehealth: Payer: Self-pay | Admitting: Nurse Practitioner

## 2016-04-14 MED ORDER — FIRST-DUKES MOUTHWASH MT SUSP: OROMUCOSAL | 0 refills | Status: AC

## 2016-04-14 NOTE — Telephone Encounter (Signed)
Left message rx sent to pharmacy

## 2016-04-14 NOTE — Telephone Encounter (Signed)
Rx sent  Laroy Apple, MD [facility] 04/14/2016, 2:59 PM

## 2016-04-22 ENCOUNTER — Ambulatory Visit: Payer: Self-pay | Admitting: Neurology

## 2016-04-25 ENCOUNTER — Other Ambulatory Visit: Payer: Self-pay | Admitting: Family Medicine

## 2016-04-25 DIAGNOSIS — G43709 Chronic migraine without aura, not intractable, without status migrainosus: Secondary | ICD-10-CM | POA: Insufficient documentation

## 2016-04-25 DIAGNOSIS — IMO0002 Reserved for concepts with insufficient information to code with codable children: Secondary | ICD-10-CM | POA: Insufficient documentation

## 2016-04-26 NOTE — Telephone Encounter (Signed)
Needs appointment.   Laroy Apple, MD [facility] 04/26/2016, 12:03 PM

## 2016-04-26 NOTE — Telephone Encounter (Signed)
Patient has been seeing DR. Wendi Snipes- do you want to approve?

## 2016-04-28 ENCOUNTER — Other Ambulatory Visit: Payer: Self-pay | Admitting: Family Medicine

## 2016-04-29 NOTE — Telephone Encounter (Signed)
Please call in valium with 0 refills

## 2016-05-02 ENCOUNTER — Encounter: Payer: Self-pay | Admitting: Neurology

## 2016-05-04 ENCOUNTER — Telehealth: Payer: Self-pay | Admitting: Neurology

## 2016-05-04 NOTE — Telephone Encounter (Signed)
Patient dismissed from [facility] by Metta Clines DO , effective May 02, 2016. Dismissal letter sent out by certified / registered mail.  DAJ

## 2016-05-11 NOTE — Telephone Encounter (Signed)
Received signed domestic return receipt verifying delivery of certified letter on May 07, 2016. Article number [id number] [place]

## 2016-05-17 ENCOUNTER — Ambulatory Visit: Payer: Self-pay | Admitting: Neurology

## 2016-05-20 ENCOUNTER — Ambulatory Visit: Payer: Self-pay | Admitting: Neurology

## 2016-05-30 ENCOUNTER — Other Ambulatory Visit: Payer: Self-pay | Admitting: Nurse Practitioner

## 2016-05-30 NOTE — Telephone Encounter (Signed)
Patient's last visit November 2017

## 2016-05-31 MED ORDER — DIAZEPAM 5 MG PO TABS
5.0000 mg | ORAL_TABLET | Freq: Two times a day (BID) | ORAL | 0 refills | Status: DC | PRN
Start: 2016-05-31 — End: 2016-06-01

## 2016-05-31 NOTE — Telephone Encounter (Signed)
Pt aware NTBS first

## 2016-05-31 NOTE — Telephone Encounter (Signed)
Refill half dose, I have had previous conversations with this patient recommending that she be seen by a psychiatrist for this medication long-term.  Will reduce dose and discuss possible titration off at follow-up.  Laroy Apple, MD [facility] 05/31/2016, 2:01 PM

## 2016-05-31 NOTE — Addendum Note (Signed)
Addended by: Timmothy Euler on: 05/31/2016 02:02 PM   Modules accepted: Orders

## 2016-05-31 NOTE — Telephone Encounter (Signed)
Appt has been made for first available on Monday 3/19 @ 3:25. Pt wants to know if this can be filled till appointment

## 2016-06-01 MED ORDER — DIAZEPAM 5 MG PO TABS: ORAL_TABLET | ORAL | 0 refills | Status: DC

## 2016-06-01 NOTE — Telephone Encounter (Signed)
Clarification needed- adjusted

## 2016-06-01 NOTE — Addendum Note (Signed)
Addended by: Timmothy Euler on: 06/01/2016 12:35 PM   Modules accepted: Orders

## 2016-06-02 NOTE — Telephone Encounter (Signed)
Refill called to [facility]

## 2016-06-03 ENCOUNTER — Telehealth: Payer: Self-pay | Admitting: Family Medicine

## 2016-06-03 ENCOUNTER — Other Ambulatory Visit: Payer: Self-pay | Admitting: Nurse Practitioner

## 2016-06-03 NOTE — Telephone Encounter (Signed)
I do not know what this medicne s for nor have I seen her in over a year

## 2016-06-03 NOTE — Telephone Encounter (Signed)
Patient aware and has appointment on Monday   

## 2016-06-03 NOTE — Telephone Encounter (Signed)
I have not prescribed this medication, she should contact whoever has prescribed it previously.   Laroy Apple, MD [facility] 06/03/2016, 12:15 PM

## 2016-06-03 NOTE — Telephone Encounter (Signed)
What is the name of the medication? cevimeline ([place]) 30 MG capsule  Have you contacted your pharmacy to request a refill? Yes it was denied has appt on 03/19 but she has puncto plugs put in eyes to help keep moisture but they were a size too big and will have to have them removed and it is causing her pain at the moment  Which pharmacy would you like this sent to? [facility]   Patient notified that their request is being sent to the clinical staff for review and that they should receive a call once it is complete. If they do not receive a call within 24 hours they can check with their pharmacy or our office.

## 2016-06-06 ENCOUNTER — Ambulatory Visit (INDEPENDENT_AMBULATORY_CARE_PROVIDER_SITE_OTHER): Payer: Medicaid Other | Admitting: Family Medicine

## 2016-06-06 ENCOUNTER — Encounter: Payer: Self-pay | Admitting: Family Medicine

## 2016-06-06 VITALS — BP 130/87 | HR 76 | Temp 96.8°F | Ht 63.0 in | Wt 170.4 lb

## 2016-06-06 DIAGNOSIS — R51 Headache: Secondary | ICD-10-CM | POA: Diagnosis not present

## 2016-06-06 DIAGNOSIS — M3500 Sicca syndrome, unspecified: Secondary | ICD-10-CM | POA: Diagnosis not present

## 2016-06-06 DIAGNOSIS — F419 Anxiety disorder, unspecified: Secondary | ICD-10-CM

## 2016-06-06 DIAGNOSIS — R519 Headache, unspecified: Secondary | ICD-10-CM

## 2016-06-06 DIAGNOSIS — M329 Systemic lupus erythematosus, unspecified: Secondary | ICD-10-CM | POA: Diagnosis not present

## 2016-06-06 MED ORDER — ALBUTEROL SULFATE HFA 108 (90 BASE) MCG/ACT IN AERS: 2.0000 | INHALATION_SPRAY | Freq: Four times a day (QID) | RESPIRATORY_TRACT | 3 refills | Status: AC | PRN

## 2016-06-06 MED ORDER — CEVIMELINE HCL 30 MG PO CAPS: ORAL_CAPSULE | ORAL | 4 refills | Status: AC

## 2016-06-06 MED ORDER — DIAZEPAM 5 MG PO TABS: ORAL_TABLET | ORAL | 1 refills | Status: DC

## 2016-06-06 MED ORDER — CETIRIZINE HCL 10 MG PO TABS: 10.0000 mg | ORAL_TABLET | Freq: Every day | ORAL | 11 refills | Status: AC

## 2016-06-06 NOTE — Patient Instructions (Signed)
Great to see you!  Please call one of the psychiatrists below to make an appointment    Your provider wants you to schedule an appointment with a Psychologist/Psychiatrist. The following list of offices requires the patient to call and make their own appointment, as there is information they need that only you can provide. Please feel free to choose form the following providers:  [place] in [place]  [place]  [phone] [place], [place]  (Scheduled through [place]) Must call and do an interview for appointment. Sees Children / Accepts Medicaid  [facility] [place]  [address], [place]    [place], [place]  [phone] [place], [place] for Autism but does not treat it Sees Children / Accepts Medicaid  [facility]    [phone] [address], [place], [place] Medication management, substance abuse, bipolar, grief, family, marriage, OCD, anxiety, PTSD Sees children / Accepts Medicaid  [facility]    [phone] [address], [place], [place] children / Accepts [facility]  [facility]  [phone] [address] [place], [place]   Dr Lorenza Evangelist     [phone] [address], [place], [place]  Sees ADD & ADHD for treatment Accepts Medicaid  [facility]  [phone] [address] Arlean Hopping, [place] for Autism Accepts [facility]  [facility]  [phone] [place], [place]  Does Adult ADD evaluations Does not accept Medicaid  [facility]   [phone] [place], [place] children as young as 48 years old Accepts [facility]     [phone]    [place], [place]  [id number] Sees children Accepts Medicaid

## 2016-06-06 NOTE — Progress Notes (Signed)
HPI  Patient presents today here to follow-up for anxiety, Sjogren's syndrome, lupus.  PT is very frustrated with sjogrens and Lupus, she states that her original Ortho and rheum beliveed her and now she is not having any luck with [facility] rheum. She would like to see another rheumatologist.   She needs a refill of Evoxac, she has used this for about 1 year and states it helps her dry mouth and dry eyes significantly.   Patient states that her anxiety is the least of her worries, , she states that she misunderstood our discussion to call for a psychiatrist, she has not upset about reducing the dose of Valium.   PMH: Smoking status noted ROS: Per HPI  Objective: BP 130/87   Pulse 76   Temp (!) 96.8 F (36 C) (Oral)   Ht 5\' 3"  (1.6 m)   Wt 170 lb 6.4 oz (77.3 kg)   LMP 03/21/2009   BMI 30.19 kg/m  Gen: NAD, alert, cooperative with exam HEENT: NCAT on the patient sprays moisturizing spray several times during our exam into her mouth. CV: RRR, good S1/S2, no murmur Resp: CTABL, no wheezes, non-labored Ext: No edema, warm Neuro: Alert and oriented, No gross deficits  Assessment and plan:  # Anxiety Refilled Valium at one half her current dose, 2.5 mg twice daily. Patient is not on an SSRI. Refer to psychiatry, list given   # Sjogren's, Lupus Symptoms stable, no new lesions. Persistent symptoms of sjogrens with dry mouth and eyes.  Refill evoxac- discussed that this is generally managed by Rheum Pt is very unhappy with ehr current rheumatologist, She would like a referral  referral placed, discucssed with referral coordinator.   Headache Following with neuro Stable, receiving botox injections which she doesn't feel is helping. States they may be getting worse.      Orders Placed This Encounter  Procedures  . Ambulatory referral to Rheumatology    Referral Priority:   Routine    Referral Type:   Consultation    Referral Reason:   Specialty Services  Required    Requested Specialty:   Rheumatology    Number of Visits Requested:   1  . Ambulatory referral to Psychiatry    Referral Priority:   Routine    Referral Type:   Psychiatric    Referral Reason:   Specialty Services Required    Requested Specialty:   Psychiatry    Number of Visits Requested:   1    Meds ordered this encounter  Medications  . metaxalone (SKELAXIN) 800 MG tablet    Sig: Take by mouth.  . DISCONTD: cetirizine (ZYRTEC) 10 MG tablet    Sig: Take 10 mg by mouth daily.  . cevimeline (EVOXAC) 30 MG capsule    Sig: TAKE ONE CAPSULE BY MOUTH THREE TIMES DAILY FOR DRY MOUTH.    Dispense:  90 capsule    Refill:  4  . albuterol (PROVENTIL HFA;VENTOLIN HFA) 108 (90 Base) MCG/ACT inhaler    Sig: Inhale 2 puffs into the lungs every 6 (six) hours as needed for wheezing or shortness of breath.    Dispense:  1 Inhaler    Refill:  3  . cetirizine (ZYRTEC) 10 MG tablet    Sig: Take 1 tablet (10 mg total) by mouth daily.    Dispense:  30 tablet    Refill:  11  . diazepam (VALIUM) 5 MG tablet    Sig: Take 1/2 tab twice daily as needed  for anxiety    Dispense:  30 tablet    Refill:  [place], MD [facility] 06/06/2016, 5:03 PM

## 2016-06-15 ENCOUNTER — Telehealth: Payer: Self-pay | Admitting: Family Medicine
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
## 2016-06-15 NOTE — Telephone Encounter (Signed)
Please advise 

## 2016-06-16 ENCOUNTER — Ambulatory Visit (INDEPENDENT_AMBULATORY_CARE_PROVIDER_SITE_OTHER): Payer: Medicaid Other | Admitting: Psychiatry

## 2016-06-16 ENCOUNTER — Encounter (HOSPITAL_COMMUNITY): Payer: Self-pay | Admitting: Psychiatry

## 2016-06-16 VITALS — BP 130/91 | HR 89 | Wt 168.4 lb

## 2016-06-16 DIAGNOSIS — Z818 Family history of other mental and behavioral disorders: Secondary | ICD-10-CM | POA: Diagnosis not present

## 2016-06-16 DIAGNOSIS — Z79899 Other long term (current) drug therapy: Secondary | ICD-10-CM

## 2016-06-16 DIAGNOSIS — F331 Major depressive disorder, recurrent, moderate: Secondary | ICD-10-CM | POA: Diagnosis not present

## 2016-06-16 DIAGNOSIS — Z811 Family history of alcohol abuse and dependence: Secondary | ICD-10-CM

## 2016-06-16 DIAGNOSIS — G4700 Insomnia, unspecified: Secondary | ICD-10-CM

## 2016-06-16 DIAGNOSIS — R45851 Suicidal ideations: Secondary | ICD-10-CM

## 2016-06-16 DIAGNOSIS — Z79891 Long term (current) use of opiate analgesic: Secondary | ICD-10-CM | POA: Diagnosis not present

## 2016-06-16 MED ORDER — QUETIAPINE FUMARATE 25 MG PO TABS: 25.0000 mg | ORAL_TABLET | Freq: Every day | ORAL | 0 refills | Status: DC

## 2016-06-16 NOTE — Patient Instructions (Addendum)
1. Start quetiapine 12.5 mg at night  2. Return to clinic in one month 3. Contact for therapy: Dr. Alford Highland Schneidmiller  [phone] [address] [place], [place] [address] 4. Consider MBCT group at [facility]
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
## 2016-07-18 ENCOUNTER — Ambulatory Visit (HOSPITAL_COMMUNITY): Payer: Self-pay | Admitting: Psychiatry
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
## 2016-07-26 ENCOUNTER — Ambulatory Visit (HOSPITAL_COMMUNITY): Payer: Medicaid Other | Admitting: Psychiatry

## 2016-09-26 ENCOUNTER — Other Ambulatory Visit (HOSPITAL_COMMUNITY): Payer: Self-pay | Admitting: Internal Medicine

## 2016-09-26 DIAGNOSIS — R599 Enlarged lymph nodes, unspecified: Secondary | ICD-10-CM

## 2016-09-29 ENCOUNTER — Ambulatory Visit (HOSPITAL_COMMUNITY)
Admission: RE | Admit: 2016-09-29 | Discharge: 2016-09-29 | Disposition: A | Discharge status: Home / Self Care | Payer: Medicaid Other | Source: Ambulatory Visit | Attending: Internal Medicine | Admitting: Internal Medicine

## 2016-10-04 ENCOUNTER — Ambulatory Visit (HOSPITAL_COMMUNITY): Payer: Medicaid Other

## 2016-10-04 ENCOUNTER — Encounter (HOSPITAL_COMMUNITY): Payer: Self-pay

## 2016-12-12 ENCOUNTER — Telehealth (HOSPITAL_COMMUNITY): Payer: Self-pay | Admitting: *Deleted

## 2016-12-12 NOTE — Telephone Encounter (Signed)
Pt walked into office stating she would like to be seen ASAP. Per pt she thought her appt was today instead of 12-27-2016. Per pt her son just killed himself on Thursday and she needs to have ref for her family to have family therapy. Per pt she have not slept since her son killed himself. Per pt she have also not eaten anything since then. Per pt, she is not suicidal to herself or anyone around her. Per pt when she walked in, she went to her PCP for pain meds but they told her that her pain clinic have to fill her med. Per pt she goes to a pain clinic in [place]. Per pt later on while RMA was typing out her message to provider, just yesterday people started coming in to check up on her and they brought in things for her to eat and she ate a little of through out the days. Per pt, due to her stress, someone had gived her Xanax to relax her and she took some.Per pt chart, pt was one and only appt was 06-16-2016. Per pt chart before 06-16-2016, pt was schedule with Dr. Harrington Challenger on 09-21-2015 and 09-23-2016 and provider was not in office and both appt was rescheduled. Pt then resch her New Pt appt from 11-10-2016 and was placed for 11-25-2015. On that day, pt No Showed for appt with Dr. Harrington Challenger. Then pt had initial appt with Dr. Modesta Messing 06-16-2016 and no show for 07-18-2016 and 07-26-2016 f/u appt. During the beginning of pt conversation, pt stated she would like to see if Dr. Modesta Messing could fill her pain medication. Per pt chart, staff resch an appt for pt to f/u with provider for 12-15-2016 and pt verbalized and showed understanding with coming to appt. Offered pt to be put on call back list for sooner appt and pt stated due to not having a car, she will just come in on 12-15-2016. Per pt she is on Medicare disability and she do not have a current copy of her Medicaid card. Per pt she only have 2015 medicaid card.
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
## 2016-12-12 NOTE — Telephone Encounter (Signed)
Although I would be happy to see her, I would not prescribe any pain medication as this is out of my specialty. Please notify this to her if she is expecting prescription for pain.

## 2016-12-13 NOTE — Telephone Encounter (Signed)
noted 

## 2016-12-15 ENCOUNTER — Ambulatory Visit (INDEPENDENT_AMBULATORY_CARE_PROVIDER_SITE_OTHER): Payer: Medicaid Other | Admitting: Psychiatry

## 2016-12-15 ENCOUNTER — Encounter (HOSPITAL_COMMUNITY): Payer: Self-pay | Admitting: Psychiatry

## 2016-12-15 VITALS — BP 127/97 | HR 76 | Ht 63.0 in | Wt 163.0 lb

## 2016-12-15 DIAGNOSIS — F419 Anxiety disorder, unspecified: Secondary | ICD-10-CM | POA: Diagnosis not present

## 2016-12-15 DIAGNOSIS — R450 Nervousness: Secondary | ICD-10-CM

## 2016-12-15 DIAGNOSIS — K589 Irritable bowel syndrome without diarrhea: Secondary | ICD-10-CM

## 2016-12-15 DIAGNOSIS — M3500 Sicca syndrome, unspecified: Secondary | ICD-10-CM | POA: Diagnosis not present

## 2016-12-15 DIAGNOSIS — G4700 Insomnia, unspecified: Secondary | ICD-10-CM

## 2016-12-15 DIAGNOSIS — F331 Major depressive disorder, recurrent, moderate: Secondary | ICD-10-CM | POA: Diagnosis not present

## 2016-12-15 DIAGNOSIS — Z811 Family history of alcohol abuse and dependence: Secondary | ICD-10-CM

## 2016-12-15 DIAGNOSIS — G43909 Migraine, unspecified, not intractable, without status migrainosus: Secondary | ICD-10-CM | POA: Diagnosis not present

## 2016-12-15 DIAGNOSIS — Z634 Disappearance and death of family member: Secondary | ICD-10-CM | POA: Diagnosis not present

## 2016-12-15 DIAGNOSIS — M797 Fibromyalgia: Secondary | ICD-10-CM

## 2016-12-15 MED ORDER — DIAZEPAM 5 MG PO TABS: 5.0000 mg | ORAL_TABLET | Freq: Every day | ORAL | 0 refills | Status: DC | PRN

## 2016-12-15 NOTE — Patient Instructions (Signed)
1. Diazepam 5 mg daily as needed for anxiety 2. Referral to [facility] 3. Return to clinic in one month for 30 mins

## 2016-12-27 ENCOUNTER — Ambulatory Visit (HOSPITAL_COMMUNITY): Payer: Medicaid Other | Admitting: Psychiatry

## 2016-12-29 ENCOUNTER — Ambulatory Visit (HOSPITAL_COMMUNITY): Payer: Self-pay | Admitting: Psychiatry

## 2017-01-02 ENCOUNTER — Encounter (HOSPITAL_COMMUNITY): Payer: Self-pay | Admitting: Psychiatry

## 2017-01-02 ENCOUNTER — Ambulatory Visit (INDEPENDENT_AMBULATORY_CARE_PROVIDER_SITE_OTHER): Payer: Medicaid Other | Admitting: Psychiatry

## 2017-01-02 VITALS — BP 122/92 | HR 73 | Ht 63.0 in | Wt 163.2 lb

## 2017-01-02 DIAGNOSIS — F419 Anxiety disorder, unspecified: Secondary | ICD-10-CM | POA: Diagnosis not present

## 2017-01-02 DIAGNOSIS — Z62810 Personal history of physical and sexual abuse in childhood: Secondary | ICD-10-CM | POA: Diagnosis not present

## 2017-01-02 DIAGNOSIS — M3500 Sicca syndrome, unspecified: Secondary | ICD-10-CM | POA: Diagnosis not present

## 2017-01-02 DIAGNOSIS — G4700 Insomnia, unspecified: Secondary | ICD-10-CM | POA: Diagnosis not present

## 2017-01-02 DIAGNOSIS — Z811 Family history of alcohol abuse and dependence: Secondary | ICD-10-CM

## 2017-01-02 DIAGNOSIS — K589 Irritable bowel syndrome without diarrhea: Secondary | ICD-10-CM

## 2017-01-02 DIAGNOSIS — F331 Major depressive disorder, recurrent, moderate: Secondary | ICD-10-CM

## 2017-01-02 DIAGNOSIS — M797 Fibromyalgia: Secondary | ICD-10-CM | POA: Diagnosis not present

## 2017-01-02 DIAGNOSIS — Z635 Disruption of family by separation and divorce: Secondary | ICD-10-CM | POA: Diagnosis not present

## 2017-01-02 DIAGNOSIS — F4310 Post-traumatic stress disorder, unspecified: Secondary | ICD-10-CM | POA: Diagnosis not present

## 2017-01-02 DIAGNOSIS — R450 Nervousness: Secondary | ICD-10-CM

## 2017-01-02 MED ORDER — DIAZEPAM 5 MG PO TABS
2.5000 mg | ORAL_TABLET | Freq: Every day | ORAL | 0 refills | Status: DC | PRN
Start: 2017-01-02 — End: 2017-04-26

## 2017-01-02 NOTE — Patient Instructions (Signed)
1. Decrease diazepam 2.5 mg daily as needed for anxiety  2. Return to clinic in one month for 30 mins
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
## 2017-01-03 ENCOUNTER — Telehealth (HOSPITAL_COMMUNITY): Payer: Self-pay | Admitting: *Deleted

## 2017-01-03 NOTE — Telephone Encounter (Signed)
Yes, seroquel can be helpful and I would recommend it if she does not have any side effect from it. I can prescribe it if she is interested. If she has question about medication I prescribed, ask her concerns. If for other medication, please advise her that it would be preferable to talk at the visit face to face rather than discussing on the phone.

## 2017-01-03 NOTE — Telephone Encounter (Signed)
Pt called stating she just came up with a question that she would like to ask provider. Per pt, per pt she wanted to know if she went back on her Seroquel, will it help with what she is going through right now. Per pt she would like for Dr. Modesta Messing to give her a call back. Per pt she would also like to discuss other medications with provider as well. Per pt her number is [phone] for today. After today please call the house number at [phone].

## 2017-01-04 ENCOUNTER — Ambulatory Visit (HOSPITAL_COMMUNITY): Payer: Self-pay | Admitting: Licensed Clinical Social Worker

## 2017-01-04 NOTE — Telephone Encounter (Signed)
noted 

## 2017-01-04 NOTE — Telephone Encounter (Signed)
Per pt she went to pain management and told them she was not starting new meds. Per pt she have to talk to them first if it's is okay for her to start this Seroquel. Per pt she will call office back with response as to if she will start taking this medication again

## 2017-01-12 ENCOUNTER — Ambulatory Visit (HOSPITAL_COMMUNITY): Payer: Self-pay | Admitting: Psychiatry

## 2017-01-19 ENCOUNTER — Ambulatory Visit (INDEPENDENT_AMBULATORY_CARE_PROVIDER_SITE_OTHER): Payer: Medicaid Other | Admitting: Licensed Clinical Social Worker

## 2017-01-19 DIAGNOSIS — F331 Major depressive disorder, recurrent, moderate: Secondary | ICD-10-CM

## 2017-01-19 DIAGNOSIS — Z634 Disappearance and death of family member: Secondary | ICD-10-CM | POA: Diagnosis not present
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
## 2017-01-30 ENCOUNTER — Ambulatory Visit (HOSPITAL_COMMUNITY): Payer: Self-pay | Admitting: Psychiatry

## 2017-02-02 ENCOUNTER — Encounter (HOSPITAL_COMMUNITY): Payer: Self-pay | Admitting: Psychiatry

## 2017-02-02 ENCOUNTER — Ambulatory Visit (HOSPITAL_COMMUNITY): Payer: Medicaid Other | Admitting: Psychiatry

## 2017-02-02 VITALS — BP 128/88 | HR 98 | Ht 63.0 in | Wt 164.0 lb

## 2017-02-02 DIAGNOSIS — G4700 Insomnia, unspecified: Secondary | ICD-10-CM

## 2017-02-02 DIAGNOSIS — F4310 Post-traumatic stress disorder, unspecified: Secondary | ICD-10-CM | POA: Diagnosis not present

## 2017-02-02 DIAGNOSIS — M797 Fibromyalgia: Secondary | ICD-10-CM | POA: Diagnosis not present

## 2017-02-02 DIAGNOSIS — Z634 Disappearance and death of family member: Secondary | ICD-10-CM

## 2017-02-02 DIAGNOSIS — G43909 Migraine, unspecified, not intractable, without status migrainosus: Secondary | ICD-10-CM | POA: Diagnosis not present

## 2017-02-02 DIAGNOSIS — F331 Major depressive disorder, recurrent, moderate: Secondary | ICD-10-CM | POA: Diagnosis not present

## 2017-02-02 DIAGNOSIS — M3500 Sicca syndrome, unspecified: Secondary | ICD-10-CM | POA: Diagnosis not present

## 2017-02-02 DIAGNOSIS — F419 Anxiety disorder, unspecified: Secondary | ICD-10-CM

## 2017-02-02 DIAGNOSIS — K589 Irritable bowel syndrome without diarrhea: Secondary | ICD-10-CM

## 2017-02-02 DIAGNOSIS — R450 Nervousness: Secondary | ICD-10-CM | POA: Diagnosis not present

## 2017-02-02 DIAGNOSIS — Z811 Family history of alcohol abuse and dependence: Secondary | ICD-10-CM

## 2017-02-02 DIAGNOSIS — F515 Nightmare disorder: Secondary | ICD-10-CM

## 2017-02-02 MED ORDER — DIAZEPAM 5 MG PO TABS: 2.5000 mg | ORAL_TABLET | Freq: Every day | ORAL | 0 refills | Status: DC | PRN

## 2017-02-02 MED ORDER — QUETIAPINE FUMARATE 25 MG PO TABS: 25.0000 mg | ORAL_TABLET | Freq: Every day | ORAL | 0 refills | Status: DC

## 2017-02-02 NOTE — Patient Instructions (Signed)
1. Start quetiapine 25 mg at night  2. Continue diazepam 2.5 mg daily as needed for anxiety  3. Return to clinic in one month for 30 mins

## 2017-02-03 ENCOUNTER — Ambulatory Visit (HOSPITAL_COMMUNITY): Payer: Self-pay | Admitting: Psychiatry

## 2017-02-14 ENCOUNTER — Ambulatory Visit (HOSPITAL_COMMUNITY): Payer: Medicaid Other | Admitting: Licensed Clinical Social Worker

## 2017-02-14 DIAGNOSIS — F331 Major depressive disorder, recurrent, moderate: Secondary | ICD-10-CM

## 2017-02-14 DIAGNOSIS — Z634 Disappearance and death of family member: Secondary | ICD-10-CM

## 2017-02-15 ENCOUNTER — Encounter (HOSPITAL_COMMUNITY): Payer: Self-pay | Admitting: Licensed Clinical Social Worker

## 2017-02-15 NOTE — Progress Notes (Signed)
   THERAPIST PROGRESS NOTE  Session Time: 3:00 pm-3:50 pm  Participation Level: Active  Behavioral Response: Well GroomedAlertDepressed  Type of Therapy: Individual Therapy  Treatment Goals addressed: Coping  Interventions: CBT and Solution Focused  Summary: Chelsey [name] is a 46 y.o. female who presents  oriented x5 (person, place, situation, time, and object), alert, casually dressed, appropriately groomed, average height, average weight, and cooperative to address mood. Patient has a history of medical treatment including lupus and migraine. Patient has a history of mental health treatment including outpatient therapy and medication management. Patient denies symptoms of mania. Patient denies suicidal and homicidal thoughts. Patient denies psychosis including auditory and visual hallucinations. Patient denies substance abuse. Patient is at low risk for lethality at this time.    Patient had an average score of 6.5 out of 10 on the Outcome Rating Scale. Patient shared her feelings and grief about her son's suicide. She is having difficulty accepting that he is deceased and she can't say the words deceased or dead. Patient reported that she is now experiencing sadness which is different than before. Patient is worried about her other son and how he is coping. She feels like she is overprotective of him. Patient also feels like people don't like her or want to be around her. After discussion, patient understood that her feelings were appropriate and they are part of the stages of grief. Patient rated the session 8.25 out of 10 on the Session Rating Scale.   Patient engaged in session. Patient responded well to interventions. Patient continues to meet criteria for Major depressive disorder, recurrent episode, moderate. Patient will continue in outpatient therapy due to being the least restrictive service to meet her needs at this time. Patient made no progress on her goal.  Suicidal/Homicidal:  Negativewithout intent/plan  Therapist Response: Therapist reviewed patient's recent thoughts and behaviors. Therapist utilized CBT to address mood. Therapist processed patient's feelings to identify triggers for mood. Therapist discussed the stages of grief. Therapist administered the Outcome Rating Scale and the Session Rating Scale.   Plan: Return again in 2 weeks. Therapist will review patient goals on or before 02.01.2019  Diagnosis: Axis I: Major depressive disorder, recurrent episode, moderate    Axis II: No diagnosis    Glori Bickers, LCSW 02/15/2017
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
## 2017-03-02 ENCOUNTER — Ambulatory Visit (HOSPITAL_COMMUNITY): Payer: Medicaid Other | Admitting: Psychiatry

## 2017-03-03 ENCOUNTER — Ambulatory Visit (HOSPITAL_COMMUNITY): Payer: Self-pay | Admitting: Psychiatry

## 2017-03-07 ENCOUNTER — Ambulatory Visit (HOSPITAL_COMMUNITY): Payer: Medicaid Other | Admitting: Licensed Clinical Social Worker
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
## 2017-04-26 ENCOUNTER — Encounter (HOSPITAL_COMMUNITY): Payer: Self-pay | Admitting: Psychiatry

## 2017-04-26 ENCOUNTER — Ambulatory Visit (HOSPITAL_COMMUNITY): Payer: Medicaid Other | Admitting: Psychiatry

## 2017-04-26 VITALS — BP 127/86 | HR 96 | Ht 63.0 in | Wt 160.0 lb

## 2017-04-26 DIAGNOSIS — F419 Anxiety disorder, unspecified: Secondary | ICD-10-CM

## 2017-04-26 DIAGNOSIS — G4700 Insomnia, unspecified: Secondary | ICD-10-CM | POA: Diagnosis not present

## 2017-04-26 DIAGNOSIS — F4310 Post-traumatic stress disorder, unspecified: Secondary | ICD-10-CM

## 2017-04-26 DIAGNOSIS — R45851 Suicidal ideations: Secondary | ICD-10-CM | POA: Diagnosis not present

## 2017-04-26 DIAGNOSIS — F331 Major depressive disorder, recurrent, moderate: Secondary | ICD-10-CM

## 2017-04-26 DIAGNOSIS — R450 Nervousness: Secondary | ICD-10-CM

## 2017-04-26 DIAGNOSIS — Z634 Disappearance and death of family member: Secondary | ICD-10-CM

## 2017-04-26 DIAGNOSIS — Z811 Family history of alcohol abuse and dependence: Secondary | ICD-10-CM

## 2017-04-26 MED ORDER — QUETIAPINE FUMARATE 25 MG PO TABS: 25.0000 mg | ORAL_TABLET | Freq: Every day | ORAL | 0 refills | Status: DC

## 2017-04-26 MED ORDER — DIAZEPAM 5 MG PO TABS: 2.5000 mg | ORAL_TABLET | Freq: Every day | ORAL | 0 refills | Status: DC | PRN

## 2017-04-26 NOTE — Patient Instructions (Signed)
1. Restart quetiapine 25 mg at night  2. Continue diazepam 2.5 mg daily as needed for anxiety  3. Return to clinic in one month for 30 mins

## 2017-05-03 ENCOUNTER — Ambulatory Visit (HOSPITAL_COMMUNITY): Payer: Medicaid Other | Admitting: Licensed Clinical Social Worker

## 2017-05-11 ENCOUNTER — Telehealth (HOSPITAL_COMMUNITY): Payer: Self-pay | Admitting: *Deleted

## 2017-05-11 NOTE — Telephone Encounter (Signed)
Prior authorization for Quetiapine received. Called [facility] tracks spoke with Joelene Millin who states that it will be approved until 05/06/18. Auth #[id number]. Called to notify pharmacy.
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
## 2017-05-25 ENCOUNTER — Ambulatory Visit (HOSPITAL_COMMUNITY): Payer: Self-pay | Admitting: Psychiatry

## 2017-05-30 ENCOUNTER — Ambulatory Visit (HOSPITAL_COMMUNITY): Payer: Medicaid Other | Admitting: Licensed Clinical Social Worker
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
## 2017-06-06 ENCOUNTER — Ambulatory Visit (HOSPITAL_COMMUNITY): Payer: Self-pay | Admitting: Psychiatry

## 2017-06-07 ENCOUNTER — Telehealth (HOSPITAL_COMMUNITY): Payer: Self-pay | Admitting: *Deleted

## 2017-06-07 ENCOUNTER — Other Ambulatory Visit (HOSPITAL_COMMUNITY): Payer: Self-pay | Admitting: Psychiatry

## 2017-06-07 MED ORDER — QUETIAPINE FUMARATE 25 MG PO TABS: 25.0000 mg | ORAL_TABLET | Freq: Every day | ORAL | 0 refills | Status: DC

## 2017-06-07 MED ORDER — DIAZEPAM 5 MG PO TABS: 2.5000 mg | ORAL_TABLET | Freq: Every day | ORAL | 0 refills | Status: AC | PRN

## 2017-06-07 MED ORDER — QUETIAPINE FUMARATE 25 MG PO TABS: 25.0000 mg | ORAL_TABLET | Freq: Every day | ORAL | 0 refills | Status: AC

## 2017-06-07 NOTE — Telephone Encounter (Signed)
Dr Modesta Messing After No Showing on appointment [facility] stated request for refill was received.

## 2017-06-07 NOTE — Telephone Encounter (Signed)
I've ordered a month of medication this morning. I believe that the front desk also notified her of discharge from the clinic. The letter will be sent.

## 2017-06-07 NOTE — Telephone Encounter (Signed)
[facility] Approved Generic Seroquel--"QUETIAPINE"  P.A.  # [id number] 05-2017 --  06-02-2018 Pharmacy Notified

## 2017-06-07 NOTE — Telephone Encounter (Signed)
ordered

## 2017-06-07 NOTE — Telephone Encounter (Signed)
Dr Modesta Messing  Pharmacy called & a prior Josem Kaufmann will be required unless we can send in a new script  Stating: Brand Name necessary & DAW 1. Current Auth is Approved until 04/2018.

## 2017-06-12 ENCOUNTER — Ambulatory Visit (HOSPITAL_COMMUNITY): Payer: Self-pay | Admitting: Psychiatry

## 2017-07-03 ENCOUNTER — Ambulatory Visit (HOSPITAL_COMMUNITY): Payer: Medicaid Other | Admitting: Licensed Clinical Social Worker

## 2017-10-23 ENCOUNTER — Other Ambulatory Visit: Payer: Self-pay | Admitting: Physical Medicine and Rehabilitation

## 2017-10-23 DIAGNOSIS — G8929 Other chronic pain: Secondary | ICD-10-CM

## 2017-10-23 DIAGNOSIS — M545 Low back pain, unspecified: Secondary | ICD-10-CM

## 2017-11-11 ENCOUNTER — Ambulatory Visit
Admission: RE | Admit: 2017-11-11 | Discharge: 2017-11-11 | Disposition: A | Discharge status: Home / Self Care | Payer: Medicaid Other | Source: Ambulatory Visit | Attending: Physical Medicine and Rehabilitation | Admitting: Physical Medicine and Rehabilitation

## 2017-11-11 DIAGNOSIS — M545 Low back pain: Principal | ICD-10-CM

## 2017-11-11 DIAGNOSIS — G8929 Other chronic pain: Secondary | ICD-10-CM

## 2021-03-16 ENCOUNTER — Other Ambulatory Visit: Payer: Self-pay | Admitting: Orthopaedic Surgery

## 2021-03-16 DIAGNOSIS — M12811 Other specific arthropathies, not elsewhere classified, right shoulder: Secondary | ICD-10-CM

## 2021-03-27 ENCOUNTER — Inpatient Hospital Stay: Admission: RE | Admit: 2021-03-27 | Payer: Medicaid Other | Source: Ambulatory Visit

## 2021-06-07 ENCOUNTER — Ambulatory Visit: Payer: Medicaid Other | Admitting: Urology

## 2021-06-09 ENCOUNTER — Other Ambulatory Visit: Payer: Self-pay | Admitting: Physical Medicine and Rehabilitation

## 2021-06-09 DIAGNOSIS — M545 Low back pain, unspecified: Secondary | ICD-10-CM

## 2021-06-20 ENCOUNTER — Ambulatory Visit
Admission: RE | Admit: 2021-06-20 | Discharge: 2021-06-20 | Disposition: A | Discharge status: Home / Self Care | Payer: Medicaid Other | Source: Ambulatory Visit | Attending: Physical Medicine and Rehabilitation | Admitting: Physical Medicine and Rehabilitation

## 2021-06-20 DIAGNOSIS — M545 Low back pain, unspecified: Secondary | ICD-10-CM

## 2021-06-22 ENCOUNTER — Other Ambulatory Visit (HOSPITAL_COMMUNITY): Payer: Self-pay | Admitting: Physical Medicine and Rehabilitation

## 2021-06-22 ENCOUNTER — Other Ambulatory Visit: Payer: Self-pay | Admitting: Physical Medicine and Rehabilitation

## 2021-06-22 DIAGNOSIS — N949 Unspecified condition associated with female genital organs and menstrual cycle: Secondary | ICD-10-CM

## 2021-06-30 ENCOUNTER — Ambulatory Visit (HOSPITAL_COMMUNITY): Payer: Medicaid Other

## 2021-07-05 ENCOUNTER — Ambulatory Visit: Payer: Medicaid Other | Admitting: Urology

## 2021-07-28 ENCOUNTER — Ambulatory Visit: Payer: Medicaid Other | Admitting: Urology

## 2021-07-28 ENCOUNTER — Encounter: Payer: Self-pay | Admitting: Urology

## 2021-07-28 DIAGNOSIS — N3281 Overactive bladder: Secondary | ICD-10-CM | POA: Diagnosis not present

## 2021-07-28 DIAGNOSIS — N393 Stress incontinence (female) (male): Secondary | ICD-10-CM

## 2021-07-28 DIAGNOSIS — R3912 Poor urinary stream: Secondary | ICD-10-CM

## 2021-07-28 LAB — URINALYSIS, ROUTINE W REFLEX MICROSCOPIC
Bilirubin, UA: NEGATIVE
Glucose, UA: NEGATIVE
Ketones, UA: NEGATIVE
Leukocytes,UA: NEGATIVE
Nitrite, UA: NEGATIVE
Protein,UA: NEGATIVE
RBC, UA: NEGATIVE
Specific Gravity, UA: <1.005 — ABNORMAL LOW (ref 1.005–1.030)
Urobilinogen, Ur: 0.2 mg/dL (ref 0.2–1.0)
pH, UA: 5.5 (ref 5.0–7.5)

## 2021-07-28 LAB — MICROSCOPIC EXAMINATION
Bacteria, UA: NONE SEEN
RBC, Urine: NONE SEEN /hpf (ref 0–2)

## 2021-07-28 LAB — BLADDER SCAN AMB NON-IMAGING: Scan Result: 0

## 2021-07-28 MED ORDER — MIRABEGRON ER 25 MG PO TB24: 25.0000 mg | ORAL_TABLET | Freq: Every day | ORAL | 0 refills | Status: AC

## 2021-07-28 MED ORDER — ALFUZOSIN HCL ER 10 MG PO TB24: 10.0000 mg | ORAL_TABLET | Freq: Every day | ORAL | 11 refills | Status: AC

## 2021-07-28 NOTE — Patient Instructions (Signed)

## 2021-07-28 NOTE — Progress Notes (Signed)
? ?07/28/2021 ?11:34 AM  ? ?Chelsey [name] ?[birth year] ?[id number] ? ?Referring provider: Donnamae Jude, FNP ?[place] ?[place],   [id number] ? ?Weak urinary stream and urinary urgency ? ? ?HPI: ?Chelsey [name] is a 51yo here for evaluation of a weak urinary stream and urinary urgency. She Has occasional SUI. She has been having an intermittent weak stream. She has daily urinary urgency and occasional urge incontinence. She has urinary frequency every hour and nocturia 3-4x. PVR 0. She is currently on amitiza for 2 years for chronic constipation. She denies numbness/tingling in fingers/toes. No hx of cervical or lumbar disc disease. No unaware incontinence. She has a hx of endometriosis.  ? ? ?PMH: ?Past Medical History:  ?Diagnosis Date  ? Allergy   ? Anemia   ? Anxiety   ? Arthritis   ? Asthma   ? Cervical cancer ([place])   ? Depression   ? Endometriosis   ? Fibromyalgia   ? Hashimoto's disease   ? Hypothyroidism   ? IBS (irritable bowel syndrome)   ? Lupus   ? Pneumonia   ? Sjogren's disease ([place])   ? Stomach pain   ? Thyroid cancer ([place])   ? ?  ? ? ?Surgical History: ?Past Surgical History:  ?Procedure Laterality Date  ? ABDOMINAL HYSTERECTOMY    ? ABLATION    ? cyst removed to uterus    ? miscarriage  2  ? OVARIAN CYST SURGERY    ? x 2  ? THROAT SURGERY    ? or cyst and tumors  ? THYROIDECTOMY    ? ? ?Home Medications:  ?Allergies as of 07/28/2021   ? ?   Reactions  ? Celebrex [celecoxib] Hives, Rash, Anaphylaxis  ? Augmentin [amoxicillin-pot Clavulanate] Diarrhea  ? Cymbalta [duloxetine Hcl]   ? Extreme nausea and weight loss  ? Duloxetine Diarrhea  ? Other reaction(s): GI Upset (intolerance) ?Extreme nausea and weight loss ?Extreme nausea and weight loss ?Extreme nausea and weight loss  ? Flexeril [cyclobenzaprine]   ? Tremors, anxiety, and eye twitching ?Tremors, anxiety, and eye twitching  ? Gabapentin   ? Nausea, insomnia  ? Lyrica [pregabalin]   ? Shaking, tremors  ? Meloxicam   ? Nausea, insomnia  ?  Prednisone Other (See Comments)  ? Other reaction(s): Other (See Comments) ?hallucinations ?Other reaction(s): Hallucinations ?Sucidal ?Other reaction(s): Mental Status Changes (intolerance) ?hallucinations  ? Savella [milnacipran Hcl]   ? Caused extreme nausea and weight loss, hallucinations, muscle rigidity  ? Sumatriptan   ? Milnacipran Anxiety  ? Caused extreme nausea and weight loss, hallucinations, muscle rigidity  ? Plaquenil [hydroxychloroquine Sulfate] Palpitations  ? Chest pain  ? ?  ? ?  ?Medication List  ?  ? ?  ? Accurate as of Jul 28, 2021 11:34 AM. If you have any questions, ask your nurse or doctor.  ?  ?  ? ?  ? ?albuterol 108 (90 Base) MCG/ACT inhaler ?Commonly known as: VENTOLIN HFA ?Inhale 2 puffs into the lungs every 6 (six) hours as needed for wheezing or shortness of breath. ?  ?Amitiza 24 MCG capsule ?Generic drug: lubiprostone ?Take 24 mcg by mouth at bedtime. ?  ?azaTHIOprine 50 MG tablet ?Commonly known as: IMURAN ?Take 200 mg by mouth 2 (two) times daily. ?  ?Buprenorphine HCl 300 MCG Film ?Place 300 mg inside cheek 2 (two) times daily. ?  ?Belbuca 600 MCG Film ?Generic drug: Buprenorphine HCl ?SMARTSIG:1 Strip(s) By Mouth Every 12 Hours ?  ?cetirizine 10  MG tablet ?Commonly known as: ZYRTEC ?Take 1 tablet (10 mg total) by mouth daily. ?  ?cevimeline 30 MG capsule ?Commonly known as: [place] ?TAKE ONE CAPSULE BY MOUTH THREE TIMES DAILY FOR DRY MOUTH. ?  ?clotrimazole-betamethasone cream ?Commonly known as: Lotrisone ?Apply 1 application topically 2 (two) times daily. ?  ?cyanocobalamin 1000 MCG/ML injection ?Commonly known as: (VITAMIN B-12) ?Inject 1,000 mcg into the muscle every 28 (twenty-eight) days. ?  ?cycloSPORINE 0.05 % ophthalmic emulsion ?Commonly known as: RESTASIS ?Place 1 drop into both eyes 2 (two) times daily. ?  ?diazepam 5 MG tablet ?Commonly known as: VALIUM ?Take 0.5 tablets (2.5 mg total) by mouth daily as needed for anxiety. ?  ?First-[place] Mouthwash Susp ?Swish and  spit 1 tablespon 4 times daily ?  ?fluconazole 150 MG tablet ?Commonly known as: DIFLUCAN ?TAKE 1 TABLET BY MOUTH ONCE AS DIRECTED. ?  ?metaxalone 800 MG tablet ?Commonly known as: SKELAXIN ?Take by mouth. ?  ?metoprolol succinate 50 MG 24 hr tablet ?Commonly known as: TOPROL-XL ?TAKE ONE TABLET BY MOUTH ONCE DAILY. ?  ?montelukast 10 MG tablet ?Commonly known as: SINGULAIR ?Take 1 tablet (10 mg total) by mouth at bedtime. ?  ?olopatadine 0.1 % ophthalmic solution ?Commonly known as: PATANOL ?Place 1 drop into both eyes 2 (two) times daily. ?  ?oxycodone 5 MG capsule ?Commonly known as: OXY-IR ?Take 20 mg by mouth every 4 (four) hours as needed. ?  ?promethazine 50 MG tablet ?Commonly known as: PHENERGAN ?TAKE 1 TABLET BY MOUTH EVERY EIGHT HOURS AS NEEDED FOR NAUSEA/VOMITING. ?  ?QUEtiapine 25 MG tablet ?Commonly known as: SEROquel ?Take 1 tablet (25 mg total) by mouth at bedtime. ?  ?thyroid 60 MG tablet ?Commonly known as: ARMOUR ?Take 60 mg by mouth. Taking 2 Tablets in AM and the Next Day take 1.5 and Back to 2 Tabs every other day ?  ? ?  ? ? ?Allergies:  ?Allergies  ?Allergen Reactions  ? Celebrex [Celecoxib] Hives, Rash and Anaphylaxis  ? Augmentin [Amoxicillin-Pot Clavulanate] Diarrhea  ? Cymbalta [Duloxetine Hcl]   ?  Extreme nausea and weight loss  ? Duloxetine Diarrhea  ?  Other reaction(s): GI Upset (intolerance) ?Extreme nausea and weight loss ?Extreme nausea and weight loss ?Extreme nausea and weight loss  ? Flexeril [Cyclobenzaprine]   ?  Tremors, anxiety, and eye twitching ?Tremors, anxiety, and eye twitching  ? Gabapentin   ?  Nausea, insomnia  ? Lyrica [Pregabalin]   ?  Shaking, tremors  ? Meloxicam   ?  Nausea, insomnia  ? Prednisone Other (See Comments)  ?  Other reaction(s): Other (See Comments) ?hallucinations ?Other reaction(s): Hallucinations ?Sucidal ?Other reaction(s): Mental Status Changes (intolerance) ?hallucinations  ? Savella [Milnacipran Hcl]   ?  Caused extreme nausea and weight  loss, hallucinations, muscle rigidity ?  ? Sumatriptan   ? Milnacipran Anxiety  ?  Caused extreme nausea and weight loss, hallucinations, muscle rigidity  ? Plaquenil [Hydroxychloroquine Sulfate] Palpitations  ?  Chest pain  ? ? ?Family History: ?Family History  ?Problem Relation Age of Onset  ? Fibromyalgia Mother   ? Thyroid nodules Mother   ? Alcohol abuse Father   ? Hypertension Sister   ? Diabetes Brother   ? ? ?Social History:  reports that she has never smoked. She has never used smokeless tobacco. She reports current alcohol use. She reports that she does not use drugs. ? ?ROS: ?All other review of systems were reviewed and are negative except what is noted above in  HPI ? ?Physical Exam: ?LMP 03/21/2009   ?Constitutional:  Alert and oriented, No acute distress. ?HEENT: [place] AT, moist mucus membranes.  Trachea midline, no masses. ?Cardiovascular: No clubbing, cyanosis, or edema. ?Respiratory: Normal respiratory effort, no increased work of breathing. ?GI: Abdomen is soft, nontender, nondistended, no abdominal masses ?GU: No CVA tenderness.  ?Lymph: No cervical or inguinal lymphadenopathy. ?Skin: No rashes, bruises or suspicious lesions. ?Neurologic: Grossly intact, no focal deficits, moving all 4 extremities. ?Psychiatric: Normal mood and affect. ? ?Laboratory Data: ?Lab Results  ?Component Value Date  ? WBC 4.4 02/27/2015  ? HGB 14.7 02/27/2015  ? HCT 41.7 02/27/2015  ? MCV 86 02/27/2015  ? PLT 308 02/27/2015  ? ? ?Lab Results  ?Component Value Date  ? CREATININE 0.66 02/27/2015  ? ? ?No results found for: PSA ? ?No results found for: TESTOSTERONE ? ?No results found for: HGBA1C ? ?Urinalysis ?   ?Component Value Date/Time  ? [place] 07/21/2014 1213  ? APPEARANCEUR CLEAR 07/21/2014 1213  ? LABSPEC 1.010 07/21/2014 1213  ? PHURINE 5.5 07/21/2014 1213  ? GLUCOSEU NEGATIVE 07/21/2014 1213  ? [place] NEGATIVE 07/21/2014 1213  ? [place] NEGATIVE 07/21/2014 1213  ? [place] NEGATIVE 07/21/2014 1213  ?  PROTEINUR NEGATIVE 07/21/2014 1213  ? UROBILINOGEN 0.2 07/21/2014 1213  ? NITRITE NEGATIVE 07/21/2014 1213  ? LEUKOCYTESUR NEGATIVE 07/21/2014 1213  ? ? ?No results found for: LABMICR, [place], RBCUA, LABEPIT,

## 2021-07-28 NOTE — Progress Notes (Signed)
post void residual=0 ?

## 2021-07-30 ENCOUNTER — Ambulatory Visit (HOSPITAL_COMMUNITY)
Admission: RE | Admit: 2021-07-30 | Discharge: 2021-07-30 | Disposition: A | Discharge status: Home / Self Care | Payer: Medicaid Other | Source: Ambulatory Visit | Attending: Physical Medicine and Rehabilitation | Admitting: Physical Medicine and Rehabilitation

## 2021-07-30 DIAGNOSIS — N949 Unspecified condition associated with female genital organs and menstrual cycle: Secondary | ICD-10-CM | POA: Insufficient documentation

## 2021-08-25 ENCOUNTER — Ambulatory Visit: Payer: Medicaid Other | Admitting: Urology

## 2022-04-13 ENCOUNTER — Other Ambulatory Visit: Payer: Self-pay | Admitting: Obstetrics and Gynecology

## 2022-04-13 DIAGNOSIS — N83201 Unspecified ovarian cyst, right side: Secondary | ICD-10-CM

## 2022-06-21 ENCOUNTER — Ambulatory Visit
Admission: RE | Admit: 2022-06-21 | Discharge: 2022-06-21 | Disposition: A | Discharge status: Home / Self Care | Payer: Medicaid Other | Source: Ambulatory Visit | Attending: Obstetrics and Gynecology | Admitting: Obstetrics and Gynecology

## 2022-06-21 DIAGNOSIS — N83201 Unspecified ovarian cyst, right side: Secondary | ICD-10-CM

## 2022-06-21 MED ORDER — IOPAMIDOL (ISOVUE-370) INJECTION 76%
77.0000 mL | Freq: Once | INTRAVENOUS | Status: AC | PRN
  Administered 2022-06-21: 77 mL via INTRAVENOUS

## 2022-11-15 IMAGING — US US PELVIS COMPLETE WITH TRANSVAGINAL
1 series · 14 of 25 positions shown · non-contrast
Comparison: 07/12/2004

Correlation: MRI lumbar spine 06/20/2021

CLINICAL DATA: LEFT adnexal CIS on MR

EXAM:
TRANSABDOMINAL AND TRANSVAGINAL ULTRASOUND OF PELVIS
TECHNIQUE: Both transabdominal and transvaginal ultrasound examinations of the
pelvis were performed. Transabdominal technique was performed for
global imaging of the pelvis including uterus, ovaries, adnexal
regions, and pelvic cul-de-sac. It was necessary to proceed with
endovaginal exam following the transabdominal exam to visualize the
ovaries and adnexa.

[Series 1: us pelvic complete with transvaginal · 14 of 73 slices shown]
[im 1/73]
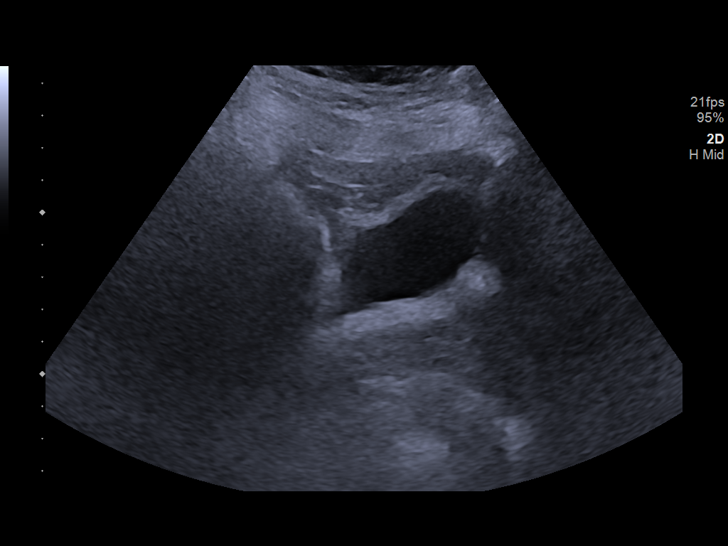
[im 7/73]
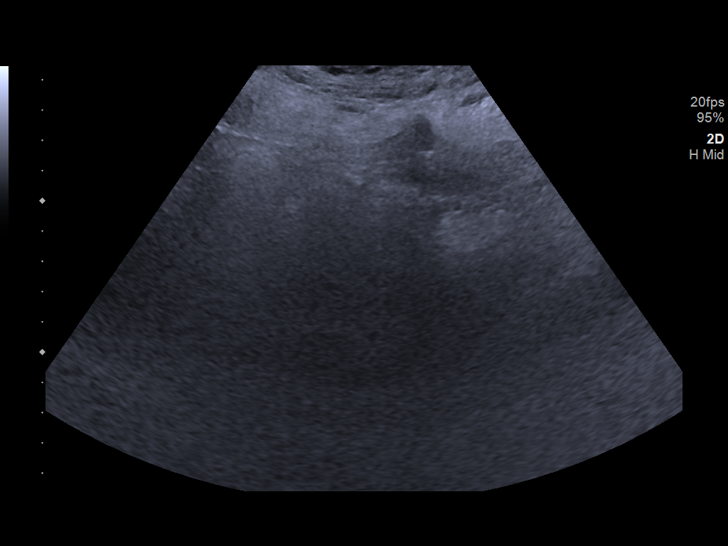
[im 13/73]
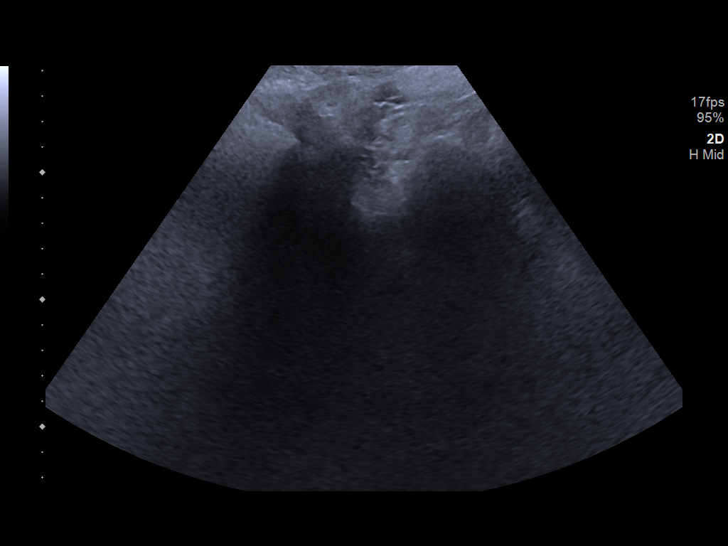
[im 19/73]
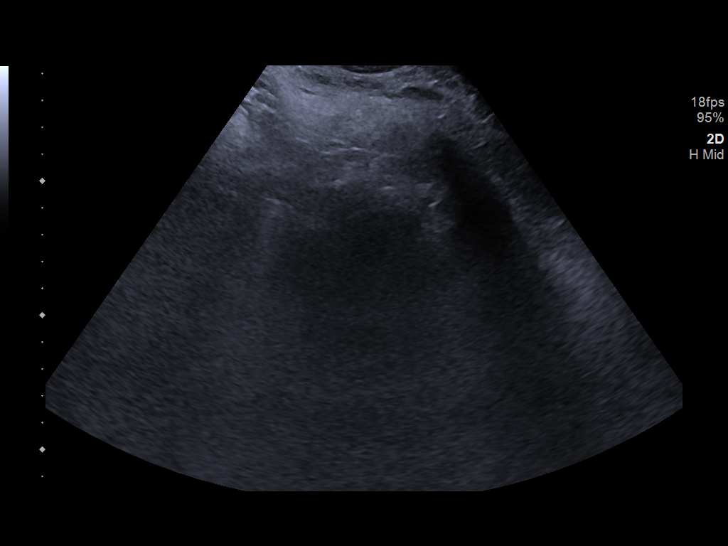
[im 25/73]
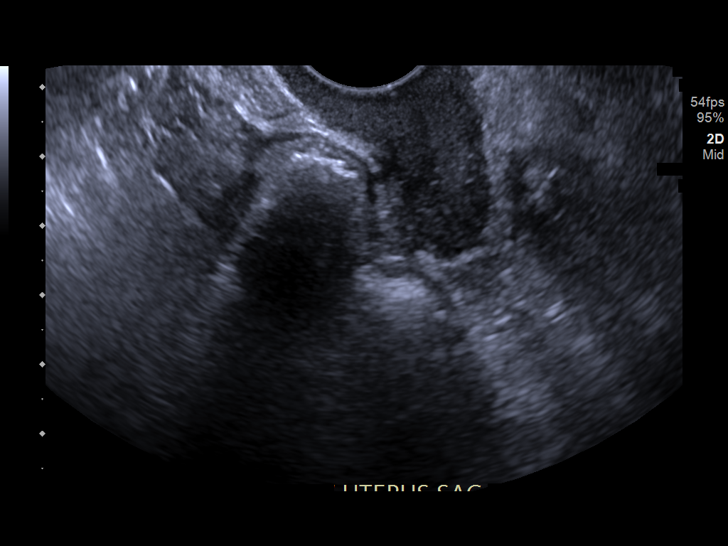
[im 28/73]
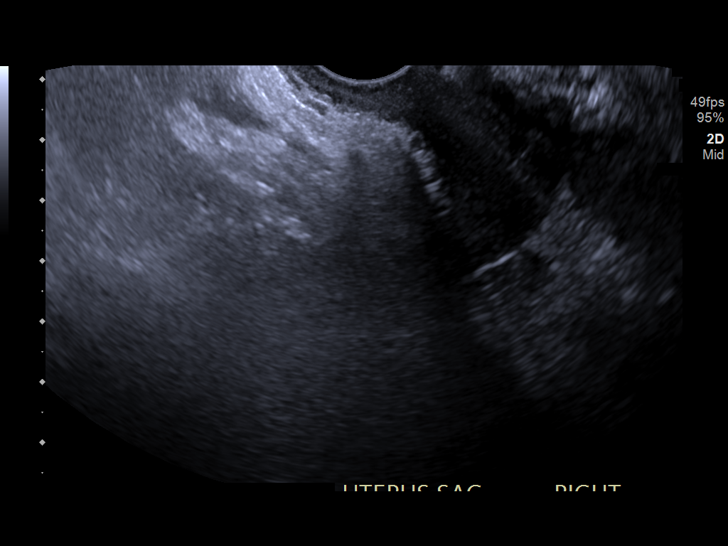
[im 34/73]
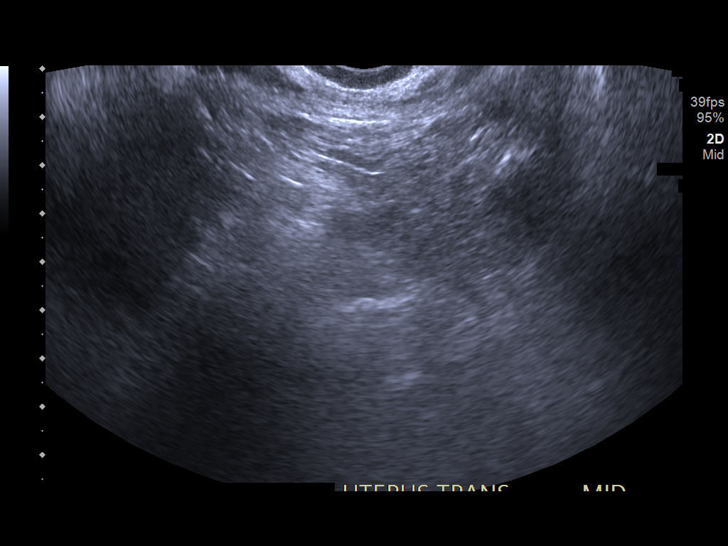
[im 40/73]
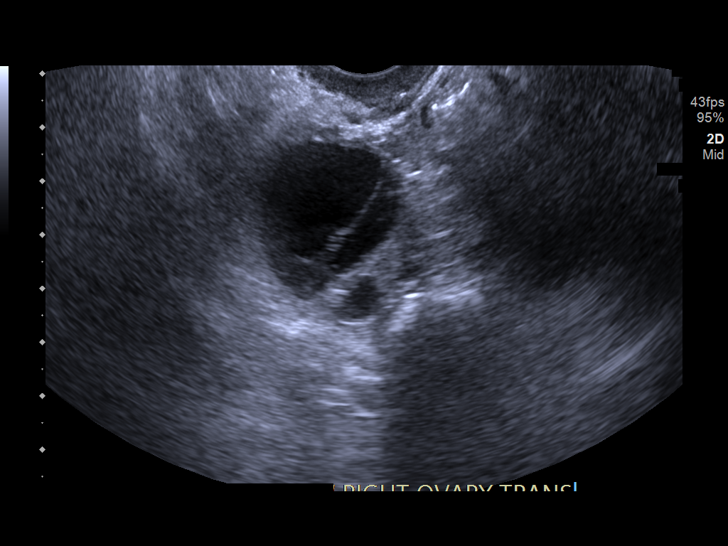
[im 46/73]
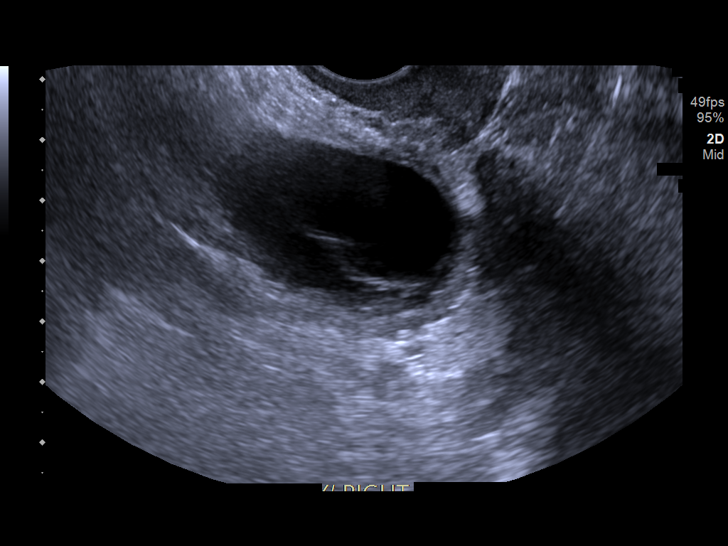
[im 49/73]
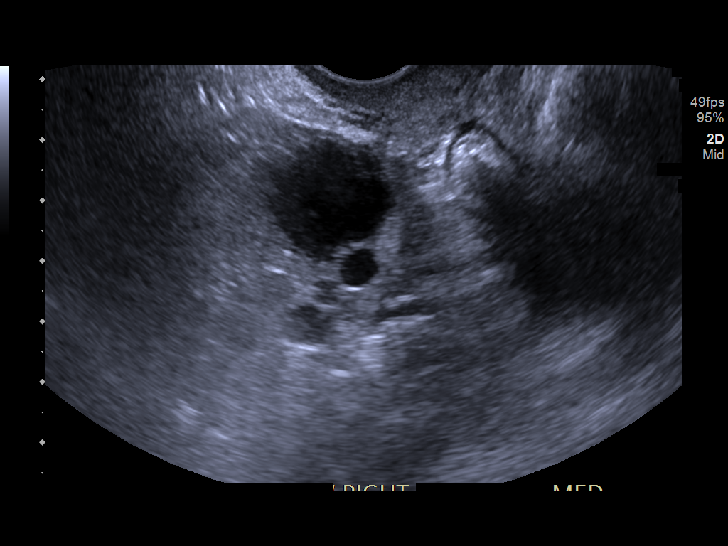
[im 55/73]
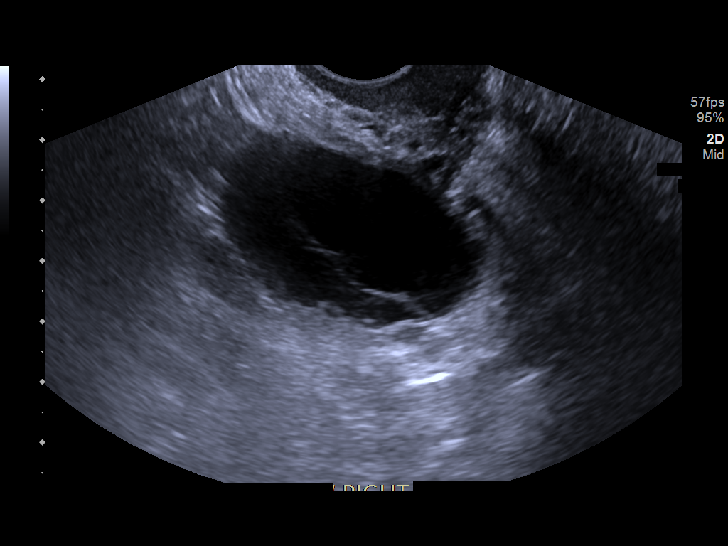
[im 61/73]
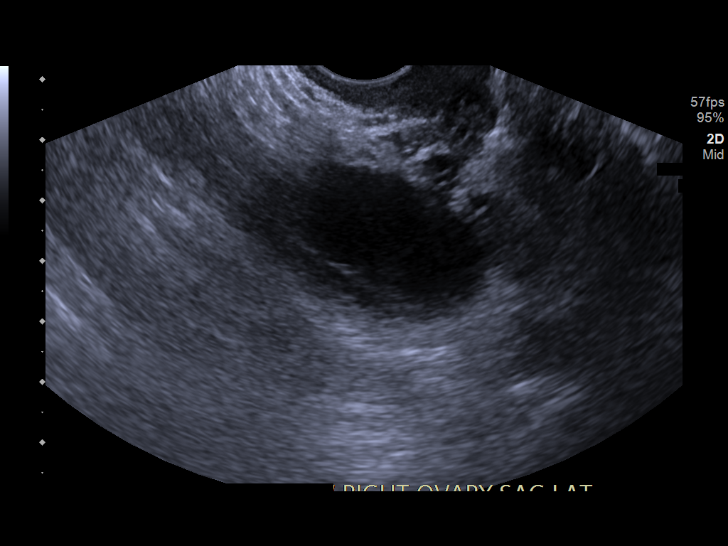
[im 67/73]
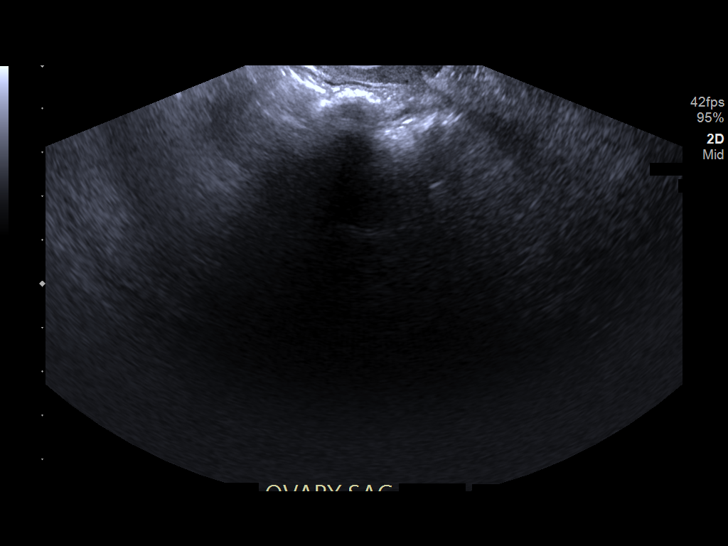
[im 73/73]
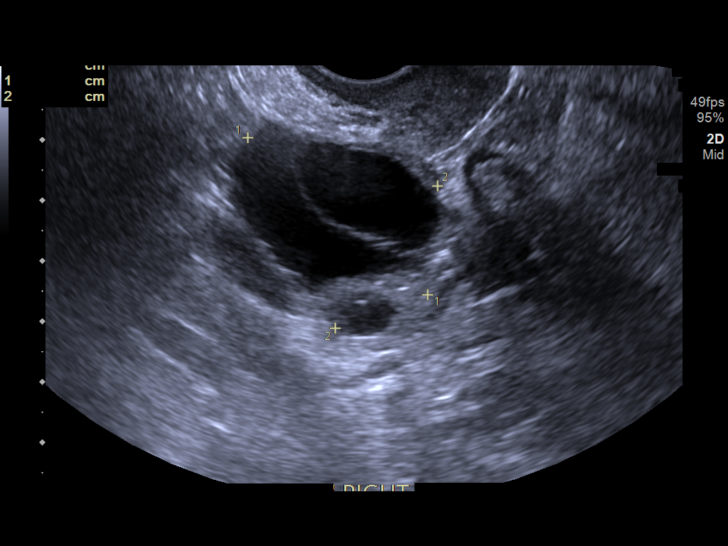

[14 of 25 positions shown; findings below may reference images not displayed]

FINDINGS: Uterus

Surgically absent

Endometrium

Surgically absent

Right ovary

Measurements: 4.0 x 2.9 x 3.0 cm = volume: 18 mL. Septated cyst
within RIGHT ovary 4.5 x 2.6 x 2.8 cm, containing a thin septation.
No mural nodularity or wall irregularity.

Left ovary

Not visualized, likely obscured by bowel

Other findings

No free pelvic fluid or adnexal masses.
IMPRESSION: Mildly complicated cyst RIGHT ovary 4.5 cm diameter, containing a
single thin septation.

This is an almost certainly benign cyst and annual follow-up
ultrasound is recommended.

Nonvisualization of LEFT ovary.

## 2022-12-09 ENCOUNTER — Other Ambulatory Visit: Payer: Self-pay | Admitting: Obstetrics and Gynecology

## 2022-12-10 ENCOUNTER — Encounter: Payer: Self-pay | Admitting: Internal Medicine

## 2022-12-14 LAB — DERMATOLOGY PATHOLOGY

## 2023-01-25 ENCOUNTER — Other Ambulatory Visit: Payer: Self-pay

## 2023-01-26 LAB — SURGICAL PATHOLOGY
